# Patient Record
Sex: Female | Born: 1980 | Race: White | Hispanic: No | Marital: Married | Smoking: Never smoker
Health system: Southern US, Community
[De-identification: ages and names within clinical notes are randomized; demographics above are authoritative.]

## PROBLEM LIST (undated history)

## (undated) DIAGNOSIS — M1990 Unspecified osteoarthritis, unspecified site: Secondary | ICD-10-CM

## (undated) DIAGNOSIS — M069 Rheumatoid arthritis, unspecified: Secondary | ICD-10-CM

## (undated) DIAGNOSIS — N200 Calculus of kidney: Secondary | ICD-10-CM

## (undated) HISTORY — DX: Rheumatoid arthritis, unspecified: M06.9

## (undated) HISTORY — PX: CHOLECYSTECTOMY: SHX55

---

## 2012-09-28 ENCOUNTER — Ambulatory Visit: Payer: Self-pay | Admitting: Neurology

## 2012-10-03 DIAGNOSIS — R3915 Urgency of urination: Secondary | ICD-10-CM | POA: Insufficient documentation

## 2012-10-03 DIAGNOSIS — R39198 Other difficulties with micturition: Secondary | ICD-10-CM | POA: Insufficient documentation

## 2012-10-03 DIAGNOSIS — N8111 Cystocele, midline: Secondary | ICD-10-CM | POA: Insufficient documentation

## 2013-03-23 ENCOUNTER — Ambulatory Visit: Payer: Self-pay | Admitting: Family Medicine

## 2013-03-23 LAB — URINALYSIS, COMPLETE
Bacteria: NEGATIVE
Bilirubin,UR: NEGATIVE
Leukocyte Esterase: NEGATIVE
Protein: NEGATIVE
Specific Gravity: 1.015 (ref 1.003–1.030)

## 2013-03-23 LAB — PREGNANCY, URINE: Pregnancy Test, Urine: NEGATIVE m[IU]/mL

## 2013-03-25 LAB — URINE CULTURE

## 2014-02-22 ENCOUNTER — Ambulatory Visit: Payer: Self-pay | Admitting: Family Medicine

## 2014-02-23 ENCOUNTER — Ambulatory Visit: Payer: Self-pay | Admitting: Physician Assistant

## 2015-03-31 ENCOUNTER — Encounter: Payer: Self-pay | Admitting: *Deleted

## 2015-03-31 ENCOUNTER — Emergency Department: Payer: No Typology Code available for payment source

## 2015-03-31 ENCOUNTER — Emergency Department
Admission: EM | Admit: 2015-03-31 | Discharge: 2015-03-31 | Disposition: A | Discharge status: Home / Self Care | Payer: No Typology Code available for payment source | Attending: Emergency Medicine | Admitting: Emergency Medicine

## 2015-03-31 DIAGNOSIS — Z79899 Other long term (current) drug therapy: Secondary | ICD-10-CM | POA: Insufficient documentation

## 2015-03-31 DIAGNOSIS — Z3202 Encounter for pregnancy test, result negative: Secondary | ICD-10-CM | POA: Insufficient documentation

## 2015-03-31 DIAGNOSIS — R1031 Right lower quadrant pain: Secondary | ICD-10-CM | POA: Diagnosis present

## 2015-03-31 DIAGNOSIS — N200 Calculus of kidney: Secondary | ICD-10-CM | POA: Insufficient documentation

## 2015-03-31 HISTORY — DX: Calculus of kidney: N20.0

## 2015-03-31 HISTORY — DX: Unspecified osteoarthritis, unspecified site: M19.90

## 2015-03-31 LAB — COMPREHENSIVE METABOLIC PANEL
ALT: 18 U/L (ref 14–54)
AST: 21 U/L (ref 15–41)
Albumin: 4.9 g/dL (ref 3.5–5.0)
Alkaline Phosphatase: 46 U/L (ref 38–126)
Anion gap: 10 (ref 5–15)
BUN: 15 mg/dL (ref 6–20)
CO2: 24 mmol/L (ref 22–32)
CREATININE: 0.56 mg/dL (ref 0.44–1.00)
Calcium: 9.2 mg/dL (ref 8.9–10.3)
Chloride: 105 mmol/L (ref 101–111)
GFR calc Af Amer: >60 mL/min (ref >60)
GFR calc non Af Amer: >60 mL/min (ref >60)
GLUCOSE: 93 mg/dL (ref 65–99)
Potassium: 3.9 mmol/L (ref 3.5–5.1)
SODIUM: 139 mmol/L (ref 135–145)
Total Bilirubin: 0.7 mg/dL (ref 0.3–1.2)
Total Protein: 7.8 g/dL (ref 6.5–8.1)

## 2015-03-31 LAB — CBC WITH DIFFERENTIAL/PLATELET
BASOS PCT: 0 %
Basophils Absolute: 0 10*3/uL (ref 0–0.1)
Eosinophils Absolute: 0.1 10*3/uL (ref 0–0.7)
Eosinophils Relative: 1 %
HCT: 38.1 % (ref 35.0–47.0)
Hemoglobin: 12.6 g/dL (ref 12.0–16.0)
LYMPHS ABS: 2.2 10*3/uL (ref 1.0–3.6)
Lymphocytes Relative: 15 %
MCH: 30.4 pg (ref 26.0–34.0)
MCHC: 32.9 g/dL (ref 32.0–36.0)
MCV: 92.4 fL (ref 80.0–100.0)
MONOS PCT: 6 %
Monocytes Absolute: 0.8 10*3/uL (ref 0.2–0.9)
Neutro Abs: 11.6 10*3/uL — ABNORMAL HIGH (ref 1.4–6.5)
Neutrophils Relative %: 78 %
Platelets: 331 10*3/uL (ref 150–440)
RBC: 4.13 MIL/uL (ref 3.80–5.20)
RDW: 12.1 % (ref 11.5–14.5)
WBC: 14.7 10*3/uL — AB (ref 3.6–11.0)

## 2015-03-31 LAB — URINALYSIS COMPLETE WITH MICROSCOPIC (ARMC ONLY)
BILIRUBIN URINE: NEGATIVE
Bacteria, UA: NONE SEEN
Glucose, UA: NEGATIVE mg/dL
Leukocytes, UA: NEGATIVE
NITRITE: NEGATIVE
PROTEIN: NEGATIVE mg/dL
Specific Gravity, Urine: 1.019 (ref 1.005–1.030)
pH: 5 (ref 5.0–8.0)

## 2015-03-31 LAB — PREGNANCY, URINE: PREG TEST UR: NEGATIVE

## 2015-03-31 MED ORDER — HYDROCODONE-ACETAMINOPHEN 5-325 MG PO TABS: 1.0000 | ORAL_TABLET | Freq: Four times a day (QID) | ORAL | Status: DC | PRN

## 2015-03-31 MED ORDER — ONDANSETRON 4 MG PO TBDP: 4.0000 mg | ORAL_TABLET | Freq: Four times a day (QID) | ORAL | Status: DC | PRN

## 2015-03-31 MED ORDER — KETOROLAC TROMETHAMINE 30 MG/ML IJ SOLN
30.0000 mg | Freq: Once | INTRAMUSCULAR | Status: AC
  Administered 2015-03-31: 30 mg via INTRAVENOUS

## 2015-03-31 MED ORDER — KETOROLAC TROMETHAMINE 30 MG/ML IJ SOLN
INTRAMUSCULAR | Status: AC
  Filled 2015-03-31: qty 1

## 2015-03-31 MED ORDER — MORPHINE SULFATE 4 MG/ML IJ SOLN
INTRAMUSCULAR | Status: AC
  Filled 2015-03-31: qty 1

## 2015-03-31 MED ORDER — ONDANSETRON HCL 4 MG/2ML IJ SOLN
INTRAMUSCULAR | Status: AC
  Filled 2015-03-31: qty 2

## 2015-03-31 MED ORDER — SODIUM CHLORIDE 0.9 % IV BOLUS (SEPSIS)
1000.0000 mL | Freq: Once | INTRAVENOUS | Status: AC
  Administered 2015-03-31: 1000 mL via INTRAVENOUS

## 2015-03-31 MED ORDER — IOHEXOL 300 MG/ML  SOLN
80.0000 mL | Freq: Once | INTRAMUSCULAR | Status: AC | PRN
  Administered 2015-03-31: 80 mL via INTRAVENOUS

## 2015-03-31 MED ORDER — IOHEXOL 240 MG/ML SOLN
25.0000 mL | Freq: Once | INTRAMUSCULAR | Status: AC | PRN
  Administered 2015-03-31: 25 mL via ORAL

## 2015-03-31 MED ORDER — MORPHINE SULFATE 4 MG/ML IJ SOLN
4.0000 mg | Freq: Once | INTRAMUSCULAR | Status: AC
  Administered 2015-03-31: 4 mg via INTRAVENOUS

## 2015-03-31 MED ORDER — TAMSULOSIN HCL 0.4 MG PO CAPS: 0.4000 mg | ORAL_CAPSULE | Freq: Every day | ORAL | Status: DC

## 2015-03-31 MED ORDER — ONDANSETRON HCL 4 MG/2ML IJ SOLN
4.0000 mg | INTRAMUSCULAR | Status: AC
  Administered 2015-03-31: 4 mg via INTRAVENOUS

## 2015-03-31 NOTE — Discharge Instructions (Signed)
Kidney Stones  Please follow-up with either [facility] or [facility]. Return to the ER right away should you develop a fever, severe pain, feel dehydrated, severe nausea, or if other concerns arise.  Kidney stones (urolithiasis) are deposits that form inside your kidneys. The intense pain is caused by the stone moving through the urinary tract. When the stone moves, the ureter goes into spasm around the stone. The stone is usually passed in the urine.  CAUSES   A disorder that makes certain neck glands produce too much parathyroid hormone (primary hyperparathyroidism).  A buildup of uric acid crystals, similar to gout in your joints.  Narrowing (stricture) of the ureter.  A kidney obstruction present at birth (congenital obstruction).  Previous surgery on the kidney or ureters.  Numerous kidney infections. SYMPTOMS   Feeling sick to your stomach (nauseous).  Throwing up (vomiting).  Blood in the urine (hematuria).  Pain that usually spreads (radiates) to the groin.  Frequency or urgency of urination. DIAGNOSIS   Taking a history and physical exam.  Blood or urine tests.  CT scan.  Occasionally, an examination of the inside of the urinary bladder (cystoscopy) is performed. TREATMENT   Observation.  Increasing your fluid intake.  Extracorporeal shock wave lithotripsy--This is a noninvasive procedure that uses shock waves to break up kidney stones.  Surgery may be needed if you have severe pain or persistent obstruction. There are various surgical procedures. Most of the procedures are performed with the use of small instruments. Only small incisions are needed to accommodate these instruments, so recovery time is minimized. The size, location, and chemical composition are all important variables that will determine the proper choice of action for you. Talk to your health care provider to better understand your situation so that you will minimize the risk of  injury to yourself and your kidney.  HOME CARE INSTRUCTIONS   Drink enough water and fluids to keep your urine clear or pale yellow. This will help you to pass the stone or stone fragments.  Strain all urine through the provided strainer. Keep all particulate matter and stones for your health care provider to see. The stone causing the pain may be as small as a grain of salt. It is very important to use the strainer each and every time you pass your urine. The collection of your stone will allow your health care provider to analyze it and verify that a stone has actually passed. The stone analysis will often identify what you can do to reduce the incidence of recurrences.  Only take over-the-counter or prescription medicines for pain, discomfort, or fever as directed by your health care provider.  Make a follow-up appointment with your health care provider as directed.  Get follow-up X-rays if required. The absence of pain does not always mean that the stone has passed. It may have only stopped moving. If the urine remains completely obstructed, it can cause loss of kidney function or even complete destruction of the kidney. It is your responsibility to make sure X-rays and follow-ups are completed. Ultrasounds of the kidney can show blockages and the status of the kidney. Ultrasounds are not associated with any radiation and can be performed easily in a matter of minutes. SEEK MEDICAL CARE IF:  You experience pain that is progressive and unresponsive to any pain medicine you have been prescribed. SEEK IMMEDIATE MEDICAL CARE IF:   Pain cannot be controlled with the prescribed medicine.  You have a fever or shaking chills.  The severity or intensity of pain increases over 18 hours and is not relieved by pain medicine.  You develop a new onset of abdominal pain.  You feel faint or pass out.  You are unable to urinate. MAKE SURE YOU:   Understand these instructions.  Will watch your  condition.  Will get help right away if you are not doing well or get worse. Document Released: 10/09/2005 Document Revised: 06/11/2013 Document Reviewed: 03/12/2013 [facility] Patient Information 2015 [place], [place]. This information is not intended to replace advice given to you by your health care provider. Make sure you discuss any questions you have with your health care provider.

## 2015-03-31 NOTE — ED Notes (Signed)
Pt ambulatory to bathroom at this time with no concerns.  

## 2015-03-31 NOTE — ED Notes (Signed)
Pt reports lower right abdominal pain and lower back pain since this morning, has only urinated a small amount today when she goes.  Pt reports history of kidney stones, and feels similar.

## 2015-03-31 NOTE — ED Provider Notes (Signed)
[facility] Emergency Department Provider Note  ____________________________________________  Time seen: Approximately 3:48 PM  I have reviewed the triage vital signs and the nursing notes.   HISTORY  Chief Complaint Abdominal Pain    HPI Rita Baker is a 34 y.o. female with a history of questionable kidney stones several years ago while in [place]. She is a very pleasant young lady, and she notes that she's been having discomfort in her right side since last night. States it feels very sharp into her back but does radiate down towards her right groin. She does not have a vomiting. She does feel slightly nauseated during the pain. The pain was worse earlier, but has improved some now. She rates it a 7 out of 10.  She's not had a fever. She denies any pain in the left side. No chest pain no cough. She had 1 loose bowel movement last night, but no blood, no vomiting.     Past Medical History  Diagnosis Date  . Kidney stone   . Arthritis     rheumatoid    There are no active problems to display for this patient.   Past Surgical History  Procedure Laterality Date  . Cesarean section      Current Outpatient Rx  Name  Route  Sig  Dispense  Refill  . HYDROcodone-acetaminophen (NORCO/VICODIN) 5-325 MG per tablet   Oral   Take 1 tablet by mouth every 6 (six) hours as needed for moderate pain.   25 tablet   0   . ondansetron (ZOFRAN ODT) 4 MG disintegrating tablet   Oral   Take 1 tablet (4 mg total) by mouth every 6 (six) hours as needed for nausea or vomiting.   20 tablet   0   . tamsulosin (FLOMAX) 0.4 MG CAPS capsule   Oral   Take 1 capsule (0.4 mg total) by mouth daily.   7 capsule   0     Allergies Review of patient's allergies indicates no known allergies.  No family history on file.  Social History History  Substance Use Topics  . Smoking status: Never Smoker   . Smokeless tobacco: Not on file  . Alcohol Use: No    Review  of Systems Constitutional: No fever/chills Eyes: No visual changes. ENT: No sore throat. Cardiovascular: Denies chest pain. Respiratory: Denies shortness of breath. Gastrointestinal: See history of present illness Genitourinary: Patient has noticed that she feels increased frequency of urination. Musculoskeletal: Negative for back pain. Skin: Negative for rash. Neurological: Negative for headaches, focal weakness or numbness.  10-point ROS otherwise negative.  ____________________________________________   PHYSICAL EXAM:  VITAL SIGNS: ED Triage Vitals  Enc Vitals Group     BP 03/31/15 1419 144/95 mmHg     Pulse Rate 03/31/15 1419 94     Resp 03/31/15 1419 18     Temp 03/31/15 1419 98.8 F (37.1 C)     Temp Source 03/31/15 1419 Oral     SpO2 03/31/15 1419 99 %     Weight 03/31/15 1419 133 lb (60.328 kg)     Height 03/31/15 1419 5\' 4"  (1.626 m)     Head Cir --      Peak Flow --      Pain Score 03/31/15 1419 8     Pain Loc --      Pain Edu? --      Excl. in GC? --     Constitutional: Alert and oriented. Well appearing and in  no acute distress. Eyes: Conjunctivae are normal. PERRL. EOMI. Head: Atraumatic. Nose: No congestion/rhinnorhea. Mouth/Throat: Mucous membranes are moist.  Oropharynx non-erythematous. Neck: No stridor.   Cardiovascular: Normal rate, regular rhythm. Grossly normal heart sounds.  Good peripheral circulation. Respiratory: Normal respiratory effort.  No retractions. Lungs CTAB. Gastrointestinal: Patient has moderate right-sided CVA tenderness, none on the left. Patient does have moderate tenderness to palpation in the left flank and right lower quadrant. His pain at McBurney's point, but this pain extends up into the right flank and does not seem to localize obviously to the right lower. No distention. No abdominal bruits. Left side nontender Musculoskeletal: No lower extremity tenderness nor edema.  No joint effusions. Neurologic:  Normal speech and  language. No gross focal neurologic deficits are appreciated. Speech is normal.  Skin:  Skin is warm, dry and intact. No rash noted. Psychiatric: Mood and affect are normal. Speech and behavior are normal.  ____________________________________________   LABS (all labs ordered are listed, but only abnormal results are displayed)  Labs Reviewed  CBC WITH DIFFERENTIAL/PLATELET - Abnormal; Notable for the following:    WBC 14.7 (*)    Neutro Abs 11.6 (*)    All other components within normal limits  URINALYSIS COMPLETEWITH MICROSCOPIC ([facility] ONLY) - Abnormal; Notable for the following:    Color, Urine YELLOW (*)    APPearance CLEAR (*)    Ketones, ur 2+ (*)    Hgb urine dipstick 3+ (*)    Squamous Epithelial / LPF 0-5 (*)    All other components within normal limits  COMPREHENSIVE METABOLIC PANEL  PREGNANCY, URINE   ____________________________________________  EKG   ____________________________________________  RADIOLOGY  IMPRESSION: 1. 6 mm obstructive calculus in the distal third of the right ureter shortly before the right ureterovesicular junction associated with mild proximal hydroureteronephrosis. 2. Several additional nonobstructive calculi within the right renal collecting system measuring 3-4 mm in size. 3. Normal appendix. 4. Additional incidental findings, as above. ____________________________________________   PROCEDURES  Procedure(s) performed: None  Critical Care performed: No  ____________________________________________   INITIAL IMPRESSION / ASSESSMENT AND PLAN / ED COURSE  Pertinent labs & imaging results that were available during my care of the patient were reviewed by me and considered in my medical decision making (see chart for details).  Patient has about 12+ hours of right back and right flank pain extending down the right pelvis. Her labs are notable for an elevated white count and hematuria. Based on her history of questionable  previous kidney stones, serum that this pain she is having may be from passage of a kidney stone, but given the amount of pain and tenderness that she does have in the right flank and also into the right lower quadrant with a history of previous rheumatoid I feel obtaining a CT with contrast to evaluate for any evidence of colitis, appendicitis is also reasonable. She doesn't have any "pelvic" pain, and I feel that her pain does not localize well to the right lower pelvis which makes torsion less likely. In consideration though, should her CT not demonstrate any acute abnormality I would recommend obtaining ovarian ultrasound.  Pain control with Toradol, IV fluids, Zofran. Patient requests not to receive any strong narcotic medicines initially. ____________________________________________  ----------------------------------------- 4:35 PM on 03/31/2015 -----------------------------------------  Reevaluated patient. She is fully awake and alert, she states her pain is improved but is still having some and requests additional medicine. I discussed and we will give her some morphine at this time.  -----------------------------------------  5:18 PM on 03/31/2015 -----------------------------------------  Patient feels much improved. She is awake and alert. I discussed her diagnosis, treatment recommendations, and follow-up with either [place] [facility] or [facility] urology as she lives in [place]. We discussed return precautions, and she is quite amenable. We also discussed safe use of Vicodin and not driving, she is quite agreeable and will use as prescribed.  FINAL CLINICAL IMPRESSION(S) / ED DIAGNOSES  Final diagnoses:  Right nephrolithiasis      Sharyn Creamer, MD 03/31/15 [phone]

## 2015-03-31 NOTE — ED Notes (Signed)
MD Quale at bedside. 

## 2015-03-31 NOTE — ED Notes (Signed)
Pt at ct at this time. Will administer meds once pt returns.

## 2015-05-20 ENCOUNTER — Ambulatory Visit (INDEPENDENT_AMBULATORY_CARE_PROVIDER_SITE_OTHER): Payer: No Typology Code available for payment source | Admitting: Urology

## 2015-05-20 ENCOUNTER — Encounter: Payer: Self-pay | Admitting: Urology

## 2015-05-20 VITALS — BP 143/95 | HR 97 | Ht 64.0 in | Wt 133.6 lb

## 2015-05-20 DIAGNOSIS — N132 Hydronephrosis with renal and ureteral calculous obstruction: Secondary | ICD-10-CM

## 2015-05-20 DIAGNOSIS — N200 Calculus of kidney: Secondary | ICD-10-CM

## 2015-05-20 LAB — URINALYSIS, COMPLETE
BILIRUBIN UA: NEGATIVE
Glucose, UA: NEGATIVE
Ketones, UA: NEGATIVE
Leukocytes, UA: NEGATIVE
NITRITE UA: NEGATIVE
Protein, UA: NEGATIVE
Specific Gravity, UA: 1.01 (ref 1.005–1.030)
Urobilinogen, Ur: 0.2 mg/dL (ref 0.2–1.0)
pH, UA: 7 (ref 5.0–7.5)

## 2015-05-20 LAB — MICROSCOPIC EXAMINATION
Bacteria, UA: NONE SEEN
RBC, UA: NONE SEEN /hpf (ref 0–?)
WBC, UA: NONE SEEN /hpf (ref 0–?)

## 2015-05-20 NOTE — Progress Notes (Addendum)
05/20/2015 3:37 PM   Rita Baker June 04, 1981 [id number]  Referring provider: Selina Cooley, MD No address on file  Chief Complaint  Patient presents with  . Nephrolithiasis    referral from [facility] x 03/31/15. he pt passed the stone on th 6/12.     HPI: Rita Baker is a 34 year old white female who was seen in the ED on  03/31/2015 for an intense right-sided flank pain. She described the pain as sharp and radiating down into her right groin. She is not having fevers or chills. She was experiencing nausea but no vomiting.    A CT scan with contrast of the abdomen and pelvis was obtained. It demonstrated a 6 mm obstructive calculus in the distal third of the right ureter shortly before the right UVJ junction associated with mild proximal hydronephrosis.  It also identified 3 nonobstructing right renal stones.  She has since passed stone.  I will send it for analysis.    Today, she is not experiencing any right sided flank pain. She is also not having any urinary symptoms. She denies any gross hematuria, fevers, chills, nausea or vomiting.  Her UA today is unremarkable.  PMH: Past Medical History  Diagnosis Date  . Kidney stone   . Arthritis     rheumatoid  . Rheumatoid arthritis     Surgical History: Past Surgical History  Procedure Laterality Date  . Cesarean section      x2    Home Medications:    Medication List       This list is accurate as of: 05/20/15  3:37 PM.  Always use your most recent med list.               HYDROcodone-acetaminophen 5-325 MG per tablet  Commonly known as:  NORCO/VICODIN  Take 1 tablet by mouth every 6 (six) hours as needed for moderate pain.     meloxicam 15 MG tablet  Commonly known as:  MOBIC     ondansetron 4 MG disintegrating tablet  Commonly known as:  ZOFRAN ODT  Take 1 tablet (4 mg total) by mouth every 6 (six) hours as needed for nausea or vomiting.     SPRINTEC 28 0.25-35 MG-MCG tablet  Generic drug:   norgestimate-ethinyl estradiol     sulfaSALAzine 500 MG tablet  Commonly known as:  AZULFIDINE     tamsulosin 0.4 MG Caps capsule  Commonly known as:  FLOMAX  Take 1 capsule (0.4 mg total) by mouth daily.     TRI-LO-SPRINTEC 0.18/0.215/0.25 MG-25 MCG tab  Generic drug:  Norgestimate-Ethinyl Estradiol Triphasic        Allergies: No Known Allergies  Family History: Family History  Problem Relation Age of Onset  . Family history unknown: Yes    Social History:  reports that she has never smoked. She does not have any smokeless tobacco history on file. She reports that she does not drink alcohol or use illicit drugs.  ROS: UROLOGY Frequent Urination?: Yes (n) Hard to postpone urination?: Yes Burning/pain with urination?: No Get up at night to urinate?: Yes Leakage of urine?: Yes Urine stream starts and stops?: No Trouble starting stream?: No Do you have to strain to urinate?: No Blood in urine?: No Urinary tract infection?: No Sexually transmitted disease?: No Injury to kidneys or bladder?: No Painful intercourse?: No Weak stream?: No Currently pregnant?: No Vaginal bleeding?: No Last menstrual period?: no  Gastrointestinal Nausea?: No Vomiting?: No Indigestion/heartburn?: No Diarrhea?: No Constipation?: No  Constitutional  Fever: No Night sweats?: No Weight loss?: No Fatigue?: No  Skin Skin rash/lesions?: No Itching?: No  Eyes Blurred vision?: No Double vision?: No  Ears/Nose/Throat Sore throat?: No Sinus problems?: No  Hematologic/Lymphatic Swollen glands?: No Easy bruising?: No  Cardiovascular Leg swelling?: No Chest pain?: No  Respiratory Cough?: No Shortness of breath?: No  Endocrine Excessive thirst?: No  Musculoskeletal Back pain?: No Joint pain?: No  Neurological Headaches?: No Dizziness?: No  Psychologic Depression?: No Anxiety?: No  Physical Exam: BP 143/95 mmHg  Pulse 97  Ht  (1.626 m)  Wt 133 lb 9.6 oz  (60.601 kg)  BMI 22.92 kg/m2  LMP 04/18/2015  Constitutional:  Alert and oriented, No acute distress. HEENT: [place] AT, moist mucus membranes.  Trachea midline, no masses. Cardiovascular: No clubbing, cyanosis, or edema. Respiratory: Normal respiratory effort, no increased work of breathing. GI: Abdomen is soft, nontender, nondistended, no abdominal masses GU: No CVA tenderness.  Skin: No rashes, bruises or suspicious lesions. Lymph: No cervical or inguinal adenopathy. Neurologic: Grossly intact, no focal deficits, moving all 4 extremities. Psychiatric: Normal mood and affect.  Laboratory Data: Lab Results  Component Value Date   WBC 14.7* 03/31/2015   HGB 12.6 03/31/2015   HCT 38.1 03/31/2015   MCV 92.4 03/31/2015   PLT 331 03/31/2015    Lab Results  Component Value Date   CREATININE 0.56 03/31/2015    No results found for: PSA  No results found for: TESTOSTERONE  No results found for: HGBA1C  Urinalysis    Component Value Date/Time   COLORURINE YELLOW* 03/31/2015 1426   COLORURINE YELLOW 03/23/2013 0850   APPEARANCEUR CLEAR* 03/31/2015 1426   APPEARANCEUR CLEAR 03/23/2013 0850   LABSPEC 1.019 03/31/2015 1426   LABSPEC 1.015 03/23/2013 0850   PHURINE 5.0 03/31/2015 1426   PHURINE 6.0 03/23/2013 0850   GLUCOSEU NEGATIVE 03/31/2015 1426   GLUCOSEU NEGATIVE 03/23/2013 0850   HGBUR 3+* 03/31/2015 1426   HGBUR 1+ 03/23/2013 0850   BILIRUBINUR NEGATIVE 03/31/2015 1426   BILIRUBINUR NEGATIVE 03/23/2013 0850   KETONESUR 2+* 03/31/2015 1426   KETONESUR NEGATIVE 03/23/2013 0850   PROTEINUR NEGATIVE 03/31/2015 1426   PROTEINUR NEGATIVE 03/23/2013 0850   NITRITE NEGATIVE 03/31/2015 1426   NITRITE NEGATIVE 03/23/2013 0850   LEUKOCYTESUR NEGATIVE 03/31/2015 1426   LEUKOCYTESUR NEGATIVE 03/23/2013 0850    Pertinent Imaging:   Assessment & Plan:    1. Nephrolithiasis:   Patient has spontaneously passed a right UVJ stone. The stone be sent for analysis. She will also  undergo a 24-hour urine workup. She has 3 remaining stones in her right kidney. She will contact us if she should feel any intense flank pain or gross hematuria.   - Urinalysis, Complete  2. Hydronephrosis:   Patient was found to have hydronephrosis on her CT scan in June. We will order a renal ultrasound to ensure the hydronephrosis has resolved since the passage of her stone.  No Follow-up on file.  Michiel Cowboy, PA-C  [facility] [address], [address] [place], [place] [address] [phone]   Addendum: Larina Bras analysis shows stone composition calcium oxalate monohydrate 75%, calcium oxalate dihydrate 10%, calcium phosphate carbonate 15%.

## 2015-05-24 ENCOUNTER — Telehealth: Payer: Self-pay | Admitting: Urology

## 2015-05-24 ENCOUNTER — Encounter: Payer: Self-pay | Admitting: Urology

## 2015-05-24 DIAGNOSIS — N132 Hydronephrosis with renal and ureteral calculous obstruction: Secondary | ICD-10-CM | POA: Insufficient documentation

## 2015-05-24 DIAGNOSIS — N200 Calculus of kidney: Secondary | ICD-10-CM | POA: Insufficient documentation

## 2015-05-24 NOTE — Telephone Encounter (Signed)
I forgot to order a RUS for the patient at the time of her visit.  Will you call the patient and apologize for me and schedule the RUS.  We have to make sure the hydronephrosis has resolved.

## 2015-05-25 NOTE — Telephone Encounter (Signed)
Spoke with pt in reference to RUS. Pt voiced understanding and all questions were answered.

## 2015-05-26 ENCOUNTER — Other Ambulatory Visit: Payer: Self-pay | Admitting: Urology

## 2015-05-26 DIAGNOSIS — N132 Hydronephrosis with renal and ureteral calculous obstruction: Secondary | ICD-10-CM

## 2015-06-01 ENCOUNTER — Ambulatory Visit
Admission: RE | Admit: 2015-06-01 | Discharge: 2015-06-01 | Disposition: A | Discharge status: Home / Self Care | Payer: No Typology Code available for payment source | Source: Ambulatory Visit | Attending: Urology | Admitting: Urology

## 2015-06-01 DIAGNOSIS — N200 Calculus of kidney: Secondary | ICD-10-CM | POA: Diagnosis not present

## 2015-06-01 DIAGNOSIS — N132 Hydronephrosis with renal and ureteral calculous obstruction: Secondary | ICD-10-CM

## 2015-06-02 ENCOUNTER — Telehealth: Payer: Self-pay

## 2015-06-02 NOTE — Telephone Encounter (Signed)
Spoke with pt in reference to u/a results. Pt stated she would like to think about treating current stones and therefore she would call back to make appt. Reinforced the need of completing LithoLink. Pt voiced understanding.

## 2015-06-02 NOTE — Telephone Encounter (Signed)
-----   Message from Vanna Scotland, MD sent at 06/01/2015  4:41 PM EDT ----- Please let patient know that her ultrasound looked good, swelling has resolved. She does have some nonobstructing stones on the right side which appeared to be decently size. She should consider treatment for these and if interested, she return to our clinic to discuss this further. At minimum, I would recommend follow-up in 1 year with [facility].  Vanna Scotland, MD

## 2015-06-10 ENCOUNTER — Other Ambulatory Visit: Payer: No Typology Code available for payment source

## 2015-06-21 ENCOUNTER — Telehealth: Payer: Self-pay | Admitting: Urology

## 2015-06-21 NOTE — Telephone Encounter (Signed)
Stone analysis has demonstrated a stone composition of calcium oxalate monohydrate, calcium oxalate dihydrate and calcium phosphate carbonate. Generally, increasing water consumption to 2 L daily, consuming citric juices such as lemonade and avoiding sodas can aid in preventing further stone formation. For a more detailed prevention plan, I recommend undergoing a 24-hour urine for metabolic workup.

## 2015-06-22 NOTE — Telephone Encounter (Signed)
LMOM

## 2015-06-22 NOTE — Telephone Encounter (Signed)
Spoke with pt in reference to stone analysis. Pt voiced understanding. Pt stated she was going to keep appt for 07/02/15 due to wanting a more detailed prevention plan.

## 2015-06-30 ENCOUNTER — Other Ambulatory Visit: Payer: Self-pay | Admitting: Urology

## 2015-07-02 ENCOUNTER — Encounter: Payer: Self-pay | Admitting: Urology

## 2015-07-02 ENCOUNTER — Ambulatory Visit (INDEPENDENT_AMBULATORY_CARE_PROVIDER_SITE_OTHER): Payer: No Typology Code available for payment source | Admitting: Urology

## 2015-07-02 VITALS — BP 132/91 | HR 91 | Resp 16 | Ht 64.0 in | Wt 131.0 lb

## 2015-07-02 DIAGNOSIS — R350 Frequency of micturition: Secondary | ICD-10-CM | POA: Diagnosis not present

## 2015-07-02 DIAGNOSIS — N200 Calculus of kidney: Secondary | ICD-10-CM

## 2015-07-02 MED ORDER — MIRABEGRON ER 25 MG PO TB24: 25.0000 mg | ORAL_TABLET | Freq: Every day | ORAL | Status: DC

## 2015-07-02 NOTE — Progress Notes (Signed)
07/02/2015 8:57 PM   Chryl Heck 16-Oct-1981 [id number]  Referring provider: Selina Cooley, MD No address on file  Chief Complaint  Patient presents with  . Follow-up    24 hour urine results    HPI: Patient presents today to discuss results of her 24-hour urine.      She was found to have suboptimal urine volumes and borderline hyperoxaluria.  I have given her the diet guides for a low oxalate diet and increasing fluids. She expressed to me that she has a longtime history of urinary frequency and that is contributing to her decrease in fluid consumption. She is afraid if she increases her fluids, it will also increase her trips to the bathroom.    She states that she has the urge to void sometimes as much as every 30 minutes. She also has nocturia 1-2. She denied any suprapubic pain, gross hematuria or dysuria. She has not sought treatment for this condition in the past.  She has some mild urinary and stress incontinence for which she does not use pads.  PMH: Past Medical History  Diagnosis Date  . Kidney stone   . Arthritis     rheumatoid  . Rheumatoid arthritis     Surgical History: Past Surgical History  Procedure Laterality Date  . Cesarean section      x2    Home Medications:    Medication List       This list is accurate as of: 07/02/15 11:59 PM.  Always use your most recent med list.               meloxicam 15 MG tablet  Commonly known as:  MOBIC     mirabegron ER 25 MG Tb24 tablet  Commonly known as:  MYRBETRIQ  Take 1 tablet (25 mg total) by mouth daily.     SPRINTEC 28 0.25-35 MG-MCG tablet  Generic drug:  norgestimate-ethinyl estradiol     sulfaSALAzine 500 MG tablet  Commonly known as:  AZULFIDINE        Allergies: No Known Allergies  Family History: Family History  Problem Relation Age of Onset  . Family history unknown: Yes    Social History:  reports that she has never smoked. She does not have any smokeless tobacco  history on file. She reports that she does not drink alcohol or use illicit drugs.  ROS: UROLOGY Frequent Urination?: Yes Hard to postpone urination?: No Burning/pain with urination?: No Get up at night to urinate?: No Leakage of urine?: Yes Urine stream starts and stops?: No Trouble starting stream?: No Do you have to strain to urinate?: No Blood in urine?: No Urinary tract infection?: No Sexually transmitted disease?: No Injury to kidneys or bladder?: No Painful intercourse?: No Weak stream?: No Currently pregnant?: No Vaginal bleeding?: No Last menstrual period?: 06/19/15  Gastrointestinal Nausea?: No Vomiting?: No Indigestion/heartburn?: No Diarrhea?: No Constipation?: No  Constitutional Fever: No Night sweats?: No Weight loss?: No Fatigue?: No  Skin Skin rash/lesions?: No Itching?: No  Eyes Blurred vision?: No Double vision?: No  Ears/Nose/Throat Sore throat?: No Sinus problems?: No  Hematologic/Lymphatic Swollen glands?: No Easy bruising?: No  Cardiovascular Leg swelling?: No Chest pain?: No  Respiratory Cough?: No Shortness of breath?: No  Endocrine Excessive thirst?: No  Musculoskeletal Back pain?: No Joint pain?: No  Neurological Headaches?: No Dizziness?: No  Psychologic Depression?: No Anxiety?: No  Physical Exam: BP 132/91 mmHg  Pulse 91  Resp 16  Ht  (1.626 m)  Wt 131 lb (59.421 kg)  BMI 22.47 kg/m2  LMP 06/19/2015   Laboratory Data: Lab Results  Component Value Date   WBC 14.7* 03/31/2015   HGB 12.6 03/31/2015   HCT 38.1 03/31/2015   MCV 92.4 03/31/2015   PLT 331 03/31/2015    Lab Results  Component Value Date   CREATININE 0.56 03/31/2015    Urinalysis    Component Value Date/Time   COLORURINE YELLOW* 03/31/2015 1426   COLORURINE YELLOW 03/23/2013 0850   APPEARANCEUR CLEAR* 03/31/2015 1426   APPEARANCEUR CLEAR 03/23/2013 0850   LABSPEC 1.019 03/31/2015 1426   LABSPEC 1.015 03/23/2013 0850    PHURINE 5.0 03/31/2015 1426   PHURINE 6.0 03/23/2013 0850   GLUCOSEU Negative 05/20/2015 1520   GLUCOSEU NEGATIVE 03/23/2013 0850   HGBUR 3+* 03/31/2015 1426   HGBUR 1+ 03/23/2013 0850   BILIRUBINUR Negative 05/20/2015 1520   BILIRUBINUR NEGATIVE 03/31/2015 1426   BILIRUBINUR NEGATIVE 03/23/2013 0850   KETONESUR 2+* 03/31/2015 1426   KETONESUR NEGATIVE 03/23/2013 0850   PROTEINUR NEGATIVE 03/31/2015 1426   PROTEINUR NEGATIVE 03/23/2013 0850   NITRITE Negative 05/20/2015 1520   NITRITE NEGATIVE 03/31/2015 1426   NITRITE NEGATIVE 03/23/2013 0850   LEUKOCYTESUR Negative 05/20/2015 1520   LEUKOCYTESUR NEGATIVE 03/31/2015 1426   LEUKOCYTESUR NEGATIVE 03/23/2013 0850    Pertinent Imaging:   Assessment & Plan:    1. Nephrolithiasis:   Patient's 24-hour urine has noted that she is not taking in enough fluid for adequate urine volumes and has a high oxalate level. I have given her dietary guidance and instructed her to increase her fluids.  2. Urinary frequency:   Patient is fearful in increasing her dietary fluids as it will cause an increase in her urinary frequency. We discussed starting the medication for her urinary frequency in hopes that it would control her frequency and give her confidence that she can increase her dietary fluids. I have given her Myrbetriq 25 mg samples (#28), a prescription and coupon for the medication. She will return in 6 weeks' time for symptom recheck and PVR.  There are no diagnoses linked to this encounter.  Return in about 6 weeks (around 08/13/2015) for PVR.  Michiel Cowboy, PA-C  [facility] [address], [address] [place], [place] [address] [phone]

## 2015-07-04 DIAGNOSIS — R350 Frequency of micturition: Secondary | ICD-10-CM | POA: Insufficient documentation

## 2015-07-12 ENCOUNTER — Other Ambulatory Visit: Payer: Self-pay | Admitting: Urology

## 2015-08-18 IMAGING — US US RENAL
1 series · 14 of 25 positions shown · non-contrast
Comparison: CT abdomen pelvis 03/31/2015.

CLINICAL DATA: Hydronephrosis.

EXAM:
RENAL / URINARY TRACT ULTRASOUND COMPLETE

[Series 1: us renal · 0.22mm/px · 14 of 53 slices shown]
[im 1/53]
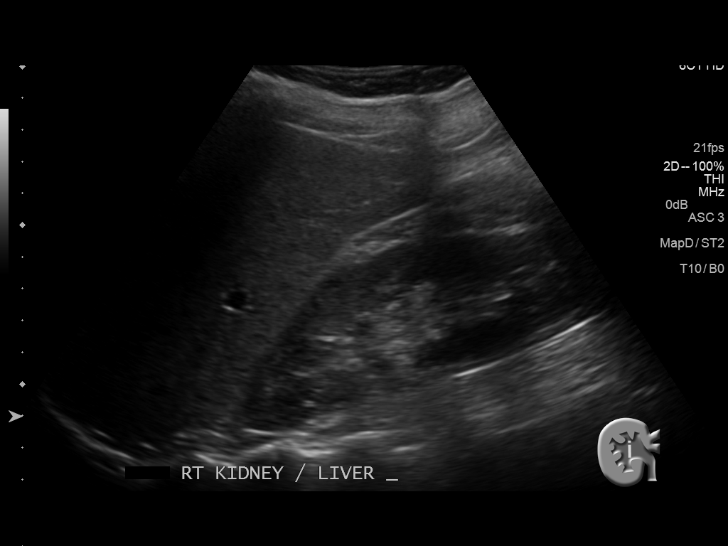
[im 5/53]
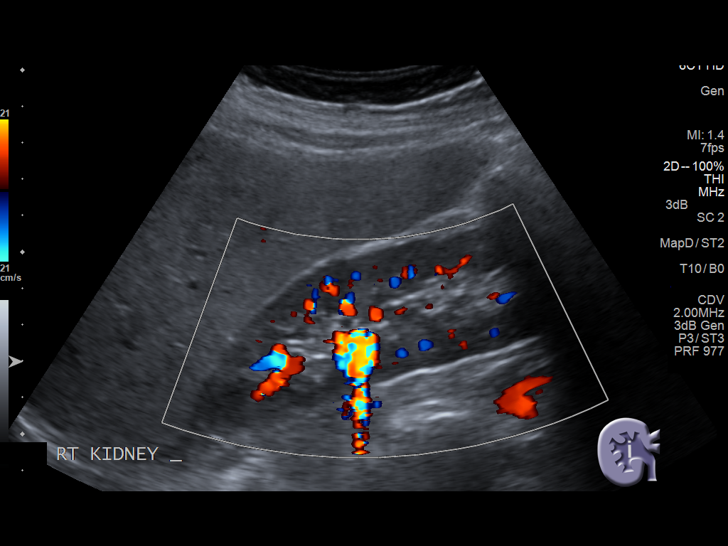
[im 9/53]
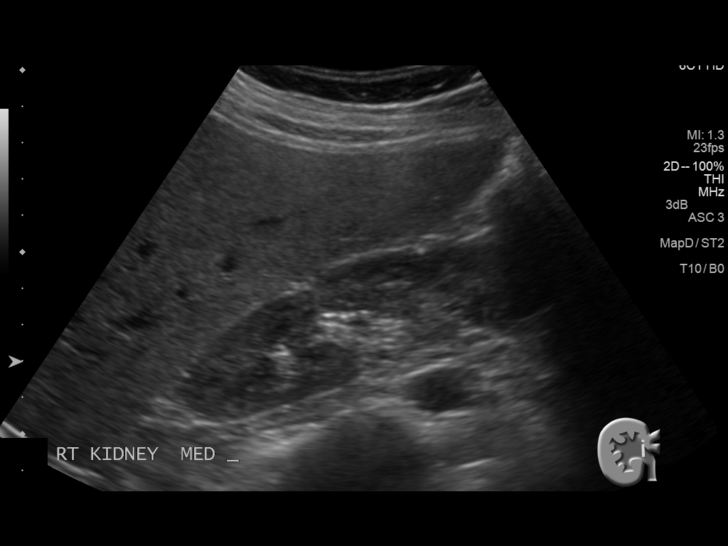
[im 14/53]
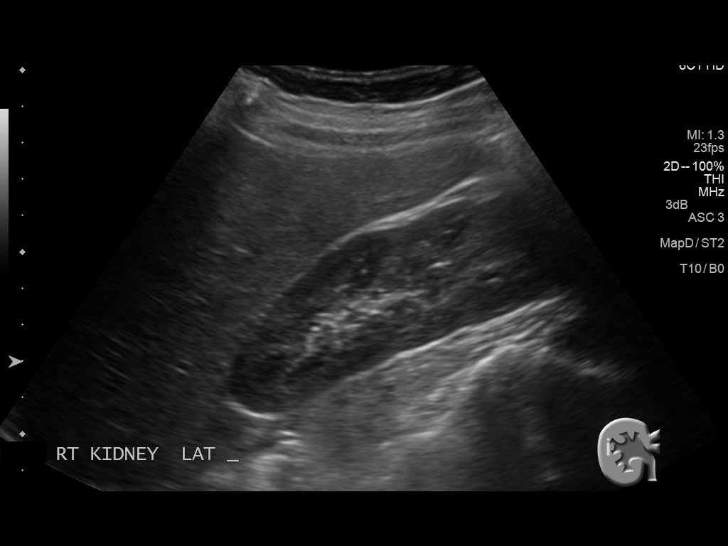
[im 18/53]
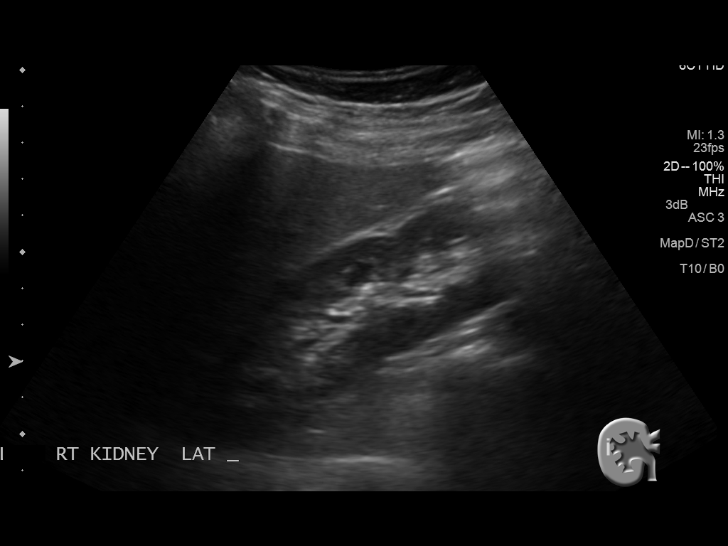
[im 20/53]
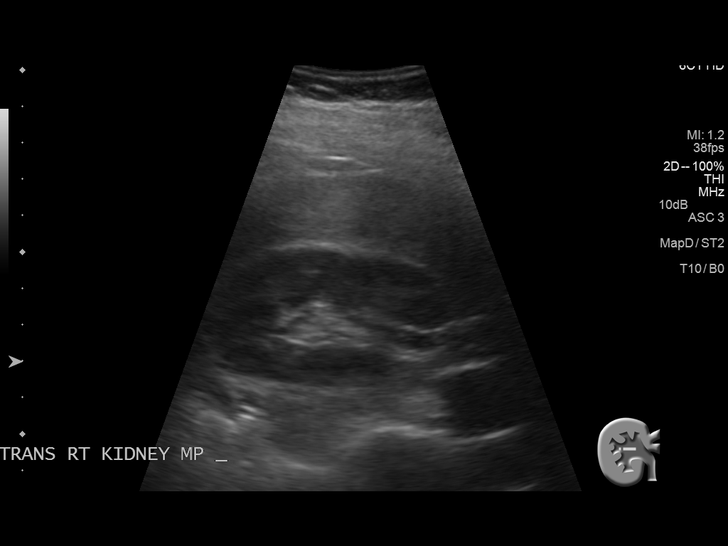
[im 24/53]
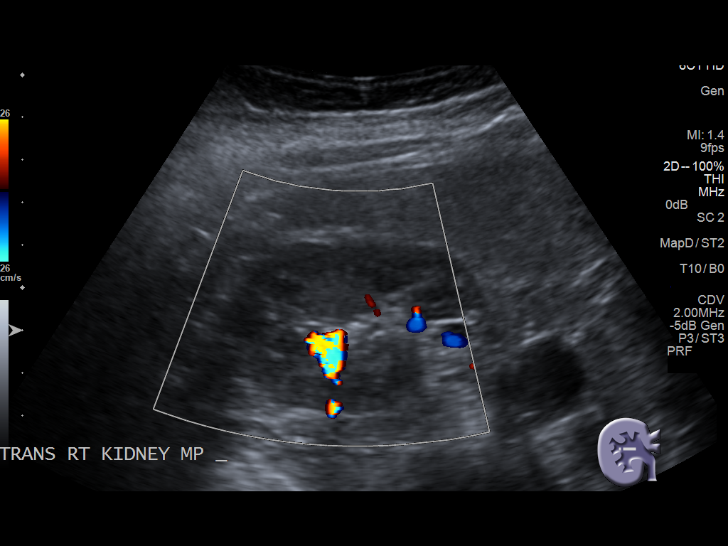
[im 29/53]
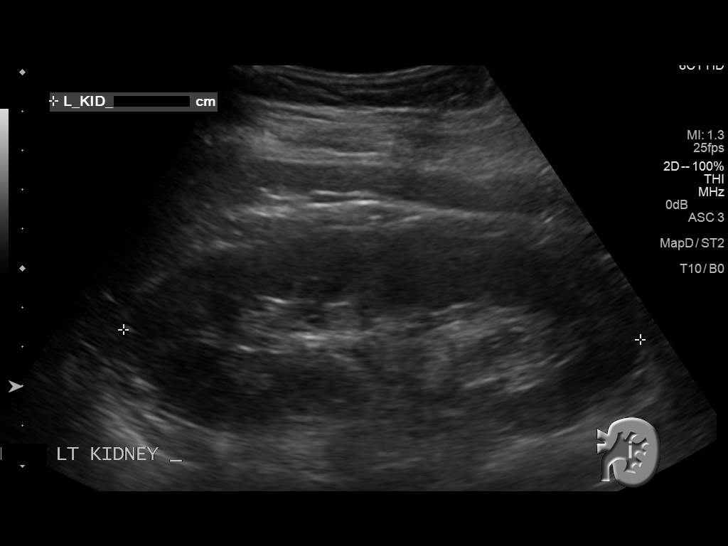
[im 33/53]
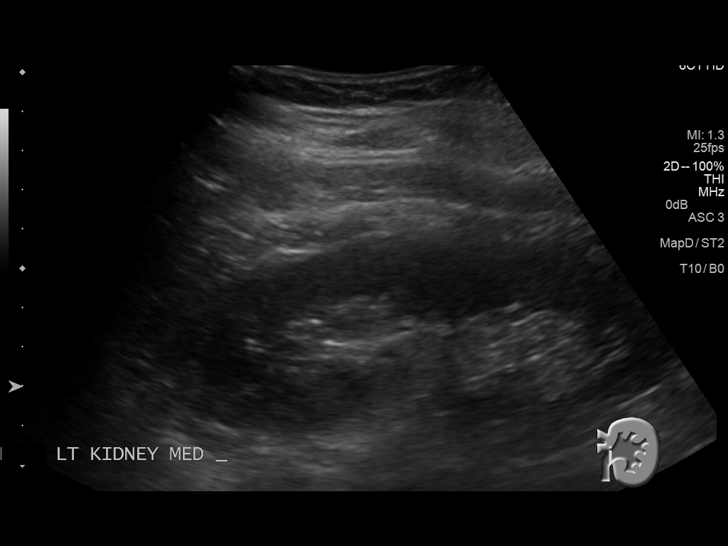
[im 35/53]
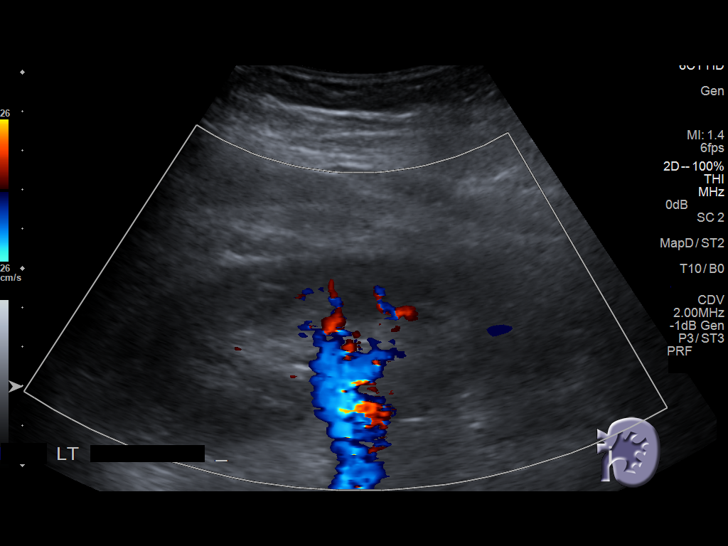
[im 40/53]
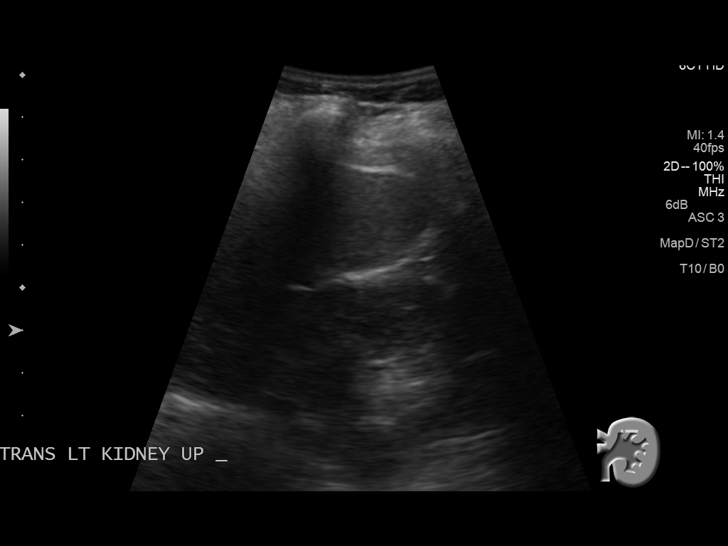
[im 44/53]
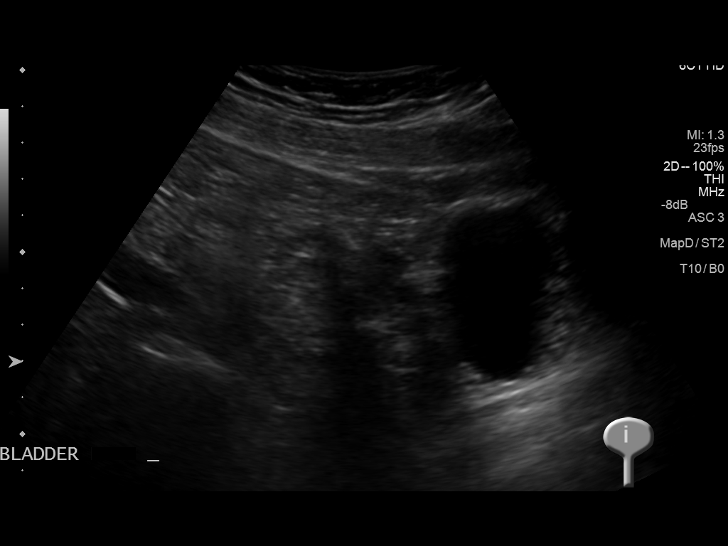
[im 48/53]
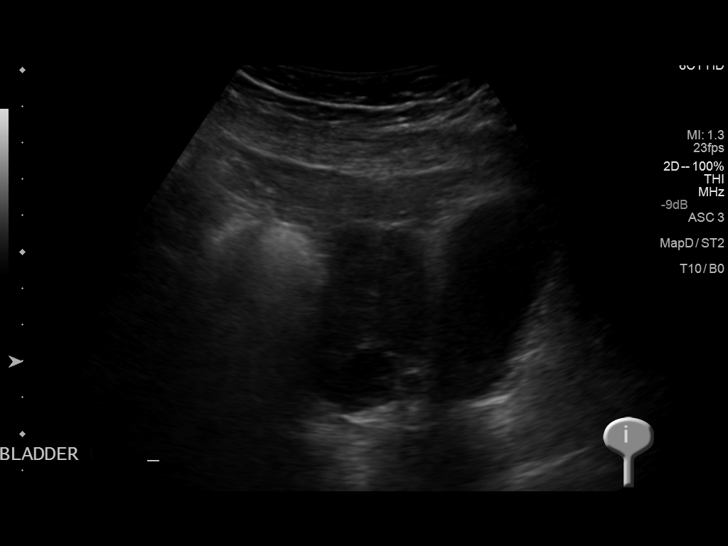
[im 53/53]
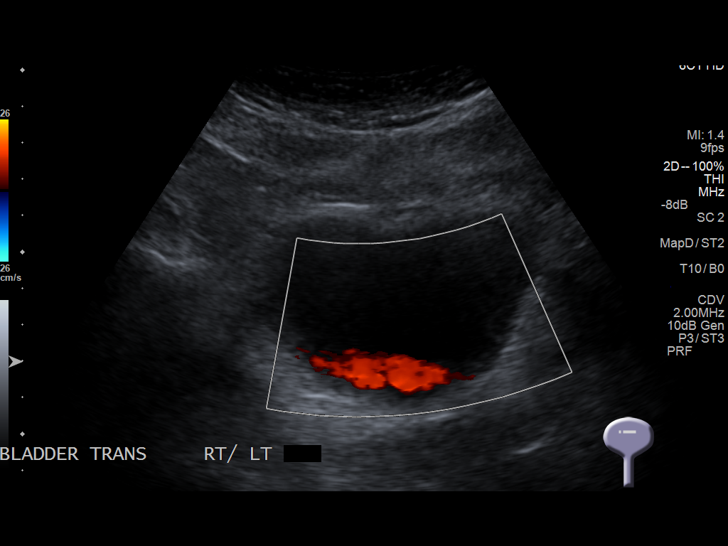

[14 of 25 positions shown; findings below may reference images not displayed]

FINDINGS: Right Kidney:

Length: 10.9 cm. Parenchymal echogenicity is within normal limits.
There are several shadowing echogenic stones, measuring up to 6 mm.
No hydronephrosis.

Left Kidney:

Length: 13.2 cm. Parenchymal echogenicity is within normal limits.
No hydronephrosis. No focal lesion.

Bladder:

Ureteral jets are seen bilaterally.
IMPRESSION: Right renal stones.  No hydronephrosis.

## 2015-11-04 ENCOUNTER — Encounter: Payer: Self-pay | Admitting: Urology

## 2016-01-11 IMAGING — CT CT ABD-PELV W/ CM
1 of 2 series · 14 of 32 positions shown, 18 images · IV contrast (omnipaque)
Comparison: No priors.

CLINICAL DATA: 33-year-old female with right lower abdominal pain
and low back pain since this morning. Difficulty urinating.

EXAM:
CT ABDOMEN AND PELVIS WITH CONTRAST
TECHNIQUE: Multidetector CT imaging of the abdomen and pelvis was performed
using the standard protocol following bolus administration of
intravenous contrast.
CONTRAST:  80mL OMNIPAQUE IOHEXOL 300 MG/ML  SOLN

[Series 2: routine abd pel with · axial · 0.66mm/px · z∈[-442,-62]mm · 14 of 87 slices shown, 18 images]
[im 7/87  soft-tissue]
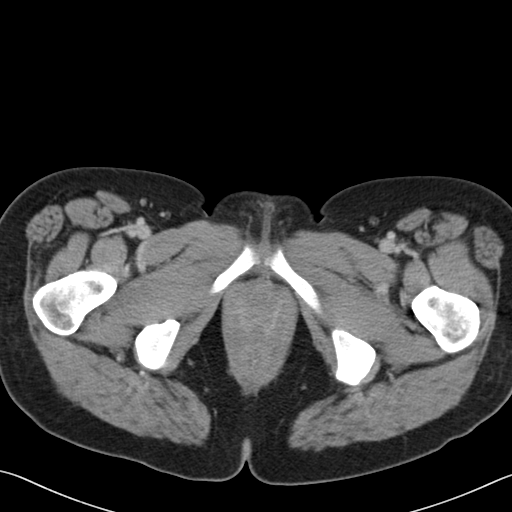
[im 7/87  bone]
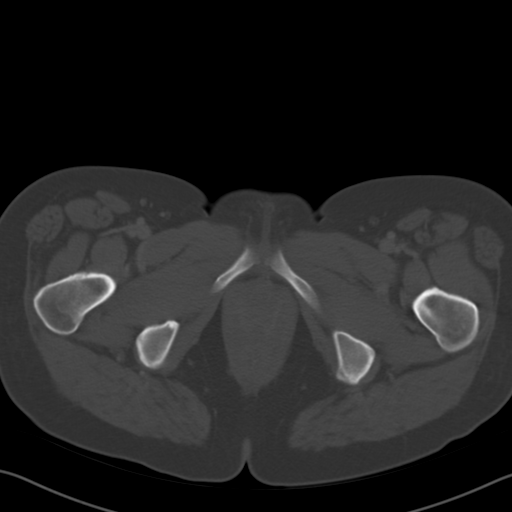
[im 14/87  soft-tissue]
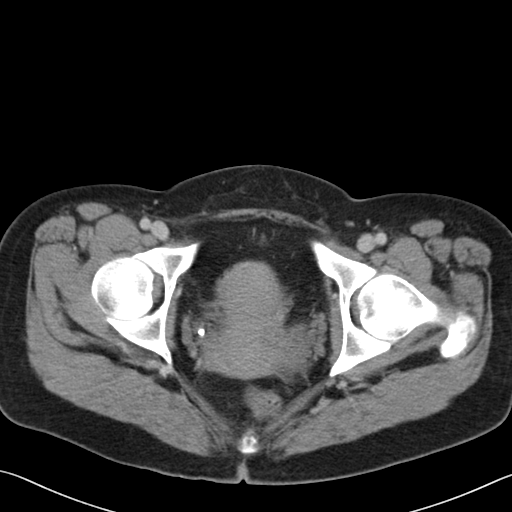
[im 21/87  soft-tissue]
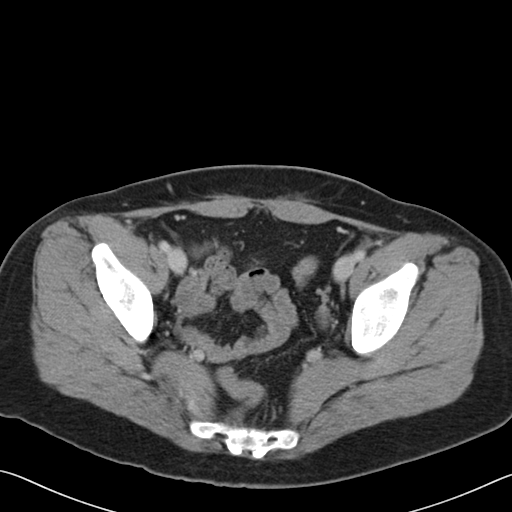
[im 28/87  soft-tissue]
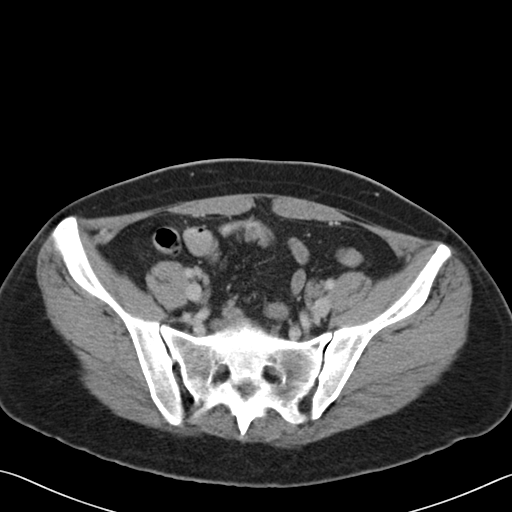
[im 35/87  soft-tissue]
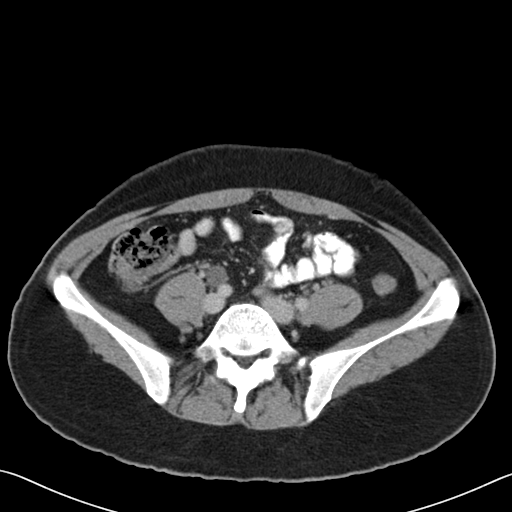
[im 42/87  soft-tissue]
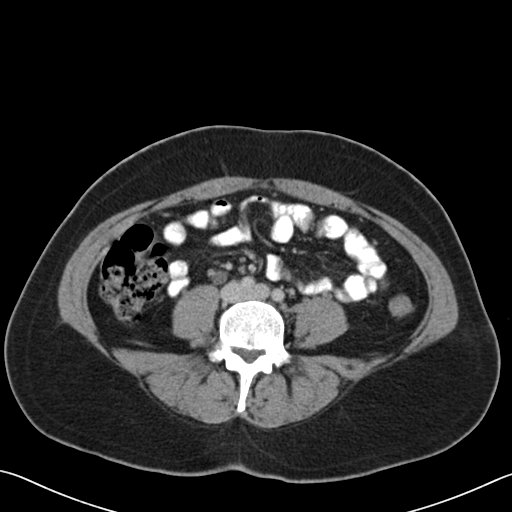
[im 49/87  soft-tissue]
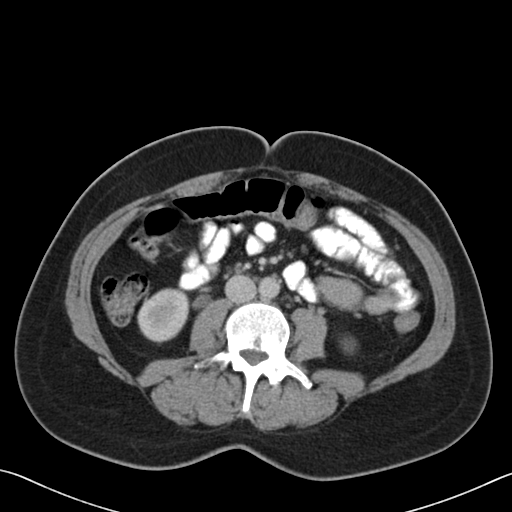
[im 56/87  soft-tissue]
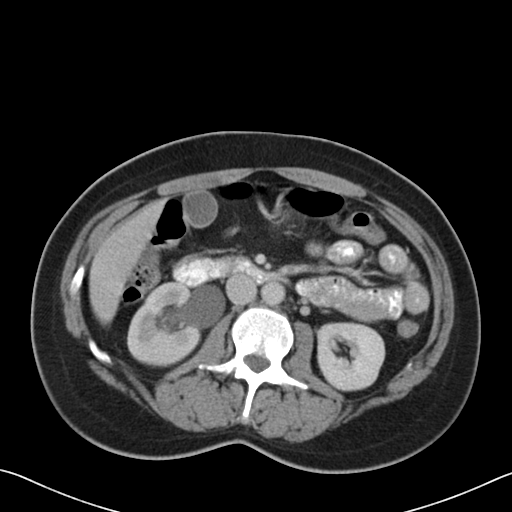
[im 62/87  soft-tissue]
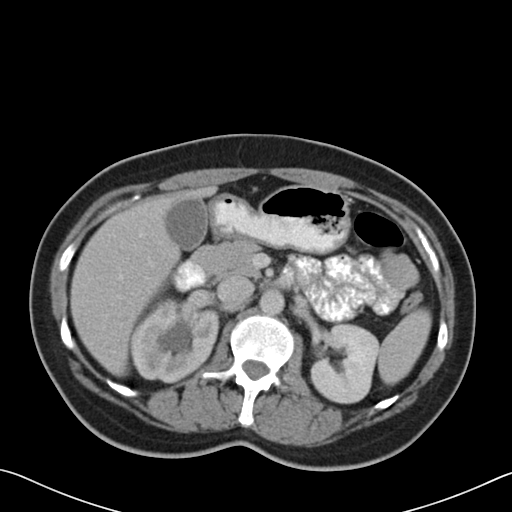
[im 62/87  bone]
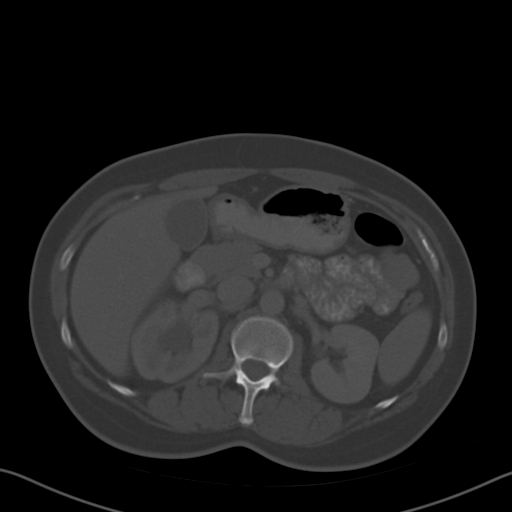
[im 69/87  soft-tissue]
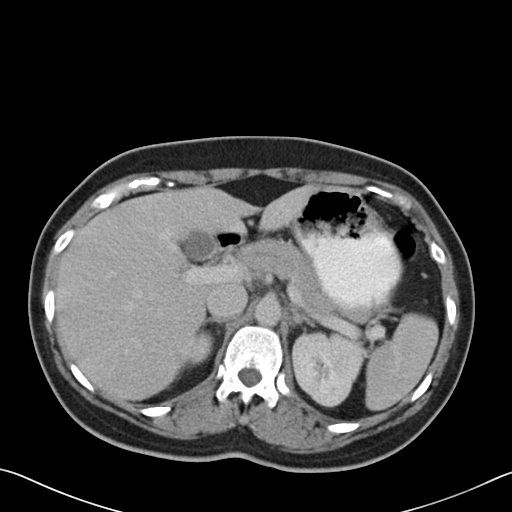
[im 73/87  lung]
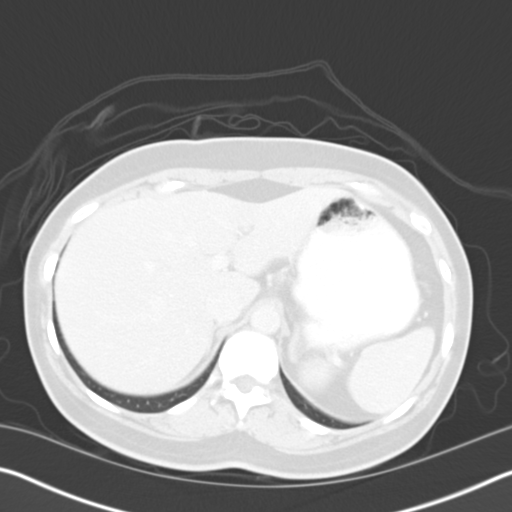
[im 76/87  soft-tissue]
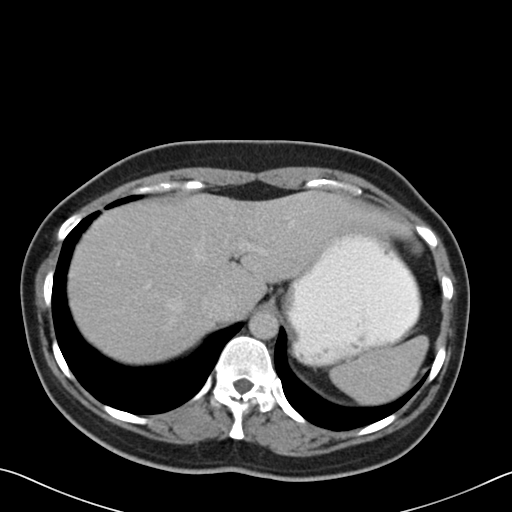
[im 76/87  lung]
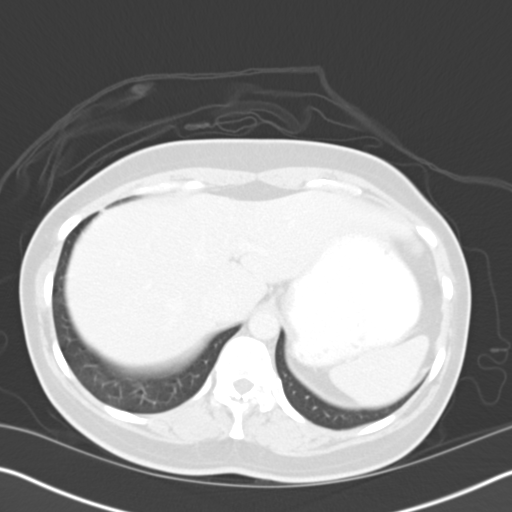
[im 80/87  lung]
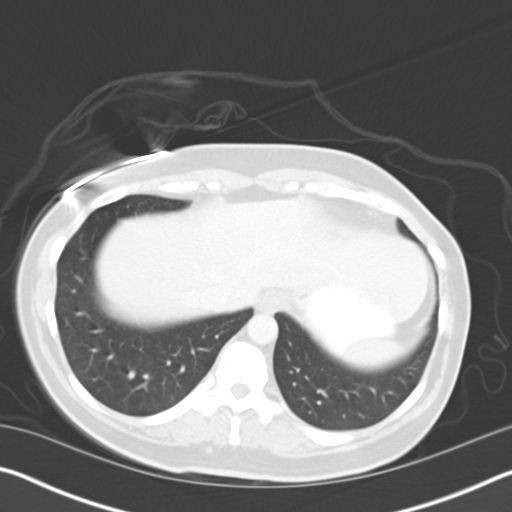
[im 83/87  soft-tissue]
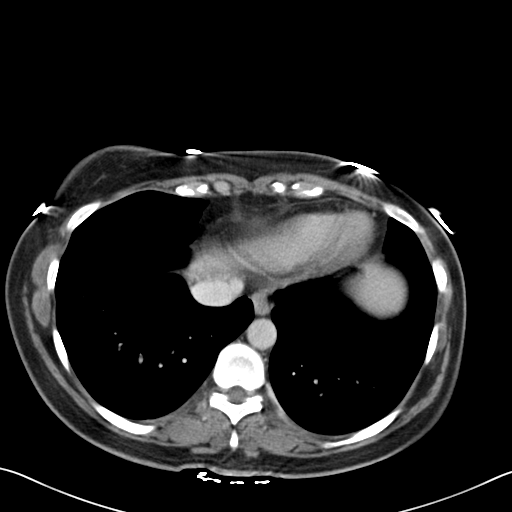
[im 83/87  lung]
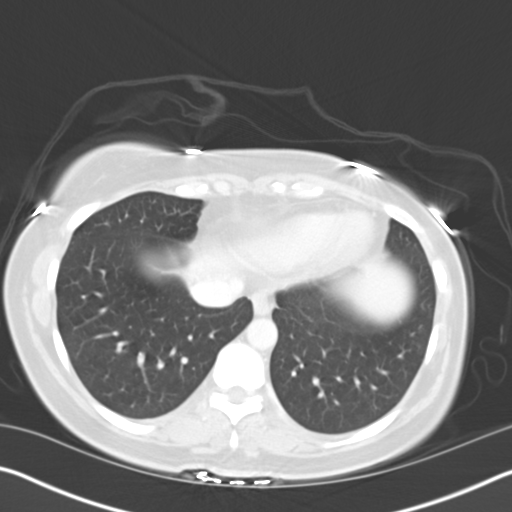

[14 of 32 positions shown; findings below may reference images not displayed]

FINDINGS: Lower chest:  Unremarkable.

Hepatobiliary: No cystic or solid hepatic lesions. No intra or
extrahepatic biliary ductal dilatation. Gallbladder is normal in
appearance.

Pancreas: No pancreatic mass. No pancreatic ductal dilatation. No
pancreatic or peripancreatic fluid or inflammatory changes.

Spleen: Unremarkable.

Adrenals/Urinary Tract: 6 mm calculus in the distal third of the
right ureter shortly before the right ureterovesicular junction
(image 74 of series 2), associated with mild right-sided
hydroureteronephrosis. Several additional small nonobstructive
calculi are present within the right renal collecting system
measuring 3-4 mm in size. Slightly delayed nephrogram on the right,
indicative of obstruction. The enhanced appearance of the kidneys is
otherwise normal bilaterally. Urinary bladder is normal in
appearance. Bilateral adrenal glands are normal in appearance.

Stomach/Bowel: Normal appearance of the stomach. No pathologic
dilatation of small bowel or colon. Normal appendix.

Vascular/Lymphatic: No significant atherosclerotic disease, aneurysm
or dissection identified in the abdominal or pelvic vasculature. No
lymphadenopathy noted in the abdomen or pelvis.

Reproductive: Uterus and left ovary are unremarkable in appearance.
Probable degenerating corpus luteum cyst in the right ovary
incidentally noted.

Other: Trace amount of free fluid in the cul-de-sac, presumably
physiologic in this young female patient. No larger volume of
ascites. No pneumoperitoneum.

Musculoskeletal: There are no aggressive appearing lytic or blastic
lesions noted in the visualized portions of the skeleton.
IMPRESSION: 1. 6 mm obstructive calculus in the distal third of the right ureter
shortly before the right ureterovesicular junction associated with
mild proximal hydroureteronephrosis.
2. Several additional nonobstructive calculi within the right renal
collecting system measuring 3-4 mm in size.
3. Normal appendix.
4. Additional incidental findings, as above.

## 2017-12-16 ENCOUNTER — Ambulatory Visit
Admission: EM | Admit: 2017-12-16 | Discharge: 2017-12-16 | Disposition: A | Discharge status: Home / Self Care | Payer: BLUE CROSS/BLUE SHIELD | Attending: Family Medicine | Admitting: Family Medicine

## 2017-12-16 DIAGNOSIS — J0100 Acute maxillary sinusitis, unspecified: Secondary | ICD-10-CM

## 2017-12-16 DIAGNOSIS — J209 Acute bronchitis, unspecified: Secondary | ICD-10-CM

## 2017-12-16 MED ORDER — FLUTICASONE PROPIONATE 50 MCG/ACT NA SUSP: 2.0000 | Freq: Every day | NASAL | 0 refills | Status: DC

## 2017-12-16 MED ORDER — ALBUTEROL SULFATE HFA 108 (90 BASE) MCG/ACT IN AERS: 1.0000 | INHALATION_SPRAY | Freq: Four times a day (QID) | RESPIRATORY_TRACT | 0 refills | Status: DC | PRN

## 2017-12-16 MED ORDER — DOXYCYCLINE HYCLATE 100 MG PO CAPS: 100.0000 mg | ORAL_CAPSULE | Freq: Two times a day (BID) | ORAL | 0 refills | Status: DC

## 2017-12-16 MED ORDER — PREDNISONE 20 MG PO TABS: 40.0000 mg | ORAL_TABLET | Freq: Every day | ORAL | 0 refills | Status: DC

## 2017-12-16 NOTE — Discharge Instructions (Signed)
Please take medications as prescribed.  Return to the ER clinic for any fevers, shortness of breath worsening symptoms or urgent changes in health.

## 2017-12-16 NOTE — ED Provider Notes (Signed)
[facility]    CSN: [id number] Arrival date & time: 12/16/17  1543     History   Chief Complaint Chief Complaint  Patient presents with  . Facial Pain    HPI Rita Heckndrea W Anspaugh is a 37 y.o. female.   Presents to the urgent care facility for evaluation of sinus pain, cough.  Symptoms have been present for 3-4 weeks.  She states 3-4 weeks ago she was diagnosed with influenza.  Was seeing a lot of improvement except for persistent cough.  Patient states a few days ago she developed severe sinus pain and pressure along the frontal and maxillary sinuses with frontal headache.  She denies any vision changes.  She states her headache is increased with looking down she has a lot of sinus drainage and pressure.  She denies any fevers.  Her cough is dry nonproductive.  She is eating and drinking well.  No nausea vomiting diarrhea.  No abdominal pain chest pain or shortness of breath.  HPI  Past Medical History:  Diagnosis Date  . Arthritis    rheumatoid  . Kidney stone   . Rheumatoid arthritis [facility](HCC)     Patient Active Problem List   Diagnosis Date Noted  . Urinary frequency 07/04/2015  . Nephrolithiasis 05/24/2015  . Hydronephrosis with urinary obstruction due to ureteral calculus 05/24/2015  . Cystocele, midline 10/03/2012  . Urgency of micturation 10/03/2012  . Difficulty in micturition 10/03/2012    Past Surgical History:  Procedure Laterality Date  . CESAREAN SECTION     x2    OB History    No data available       Home Medications    Prior to Admission medications   Medication Sig Start Date End Date Taking? Authorizing Provider  albuterol (PROVENTIL HFA;VENTOLIN HFA) 108 (90 Base) MCG/ACT inhaler Inhale 1-2 puffs into the lungs every 6 (six) hours as needed for wheezing or shortness of breath. 12/16/17   Evon SlackGaines, Thomas C, PA-C  doxycycline (VIBRAMYCIN) 100 MG capsule Take 1 capsule (100 mg total) by mouth 2 (two) times daily. 12/16/17   Evon SlackGaines, Thomas C, PA-C    fluticasone (FLONASE) 50 MCG/ACT nasal spray Place 2 sprays into both nostrils daily. 12/16/17   Evon SlackGaines, Thomas C, PA-C  meloxicam (MOBIC) 15 MG tablet  05/04/15   [provider]  mirabegron ER (MYRBETRIQ) 25 MG TB24 tablet Take 1 tablet (25 mg total) by mouth daily. 07/02/15   Harle BattiestMcGowan, Shannon A, PA-C  norgestimate-ethinyl estradiol (SPRINTEC 28) 0.25-35 MG-MCG tablet  05/05/15   [provider]  predniSONE (DELTASONE) 20 MG tablet Take 2 tablets (40 mg total) by mouth daily. 12/16/17   Evon SlackGaines, Thomas C, PA-C  sulfaSALAzine (AZULFIDINE) 500 MG tablet  05/04/15   [provider]    Family History Family History  Family history unknown: Yes    Social History Social History   Tobacco Use  . Smoking status: Never Smoker  Substance Use Topics  . Alcohol use: No    Alcohol/week: 0.0 oz  . Drug use: No     Allergies   Patient has no known allergies.   Review of Systems Review of Systems  Constitutional: Negative for fever.  HENT: Positive for congestion, rhinorrhea, sinus pressure and sinus pain. Negative for ear discharge, sore throat, trouble swallowing and voice change.   Eyes: Negative for photophobia and visual disturbance.  Respiratory: Positive for cough and wheezing (mild). Negative for shortness of breath and stridor.   Cardiovascular: Negative for chest pain.  Gastrointestinal: Negative for abdominal pain, diarrhea, nausea and vomiting.  Genitourinary: Negative for dysuria, flank pain and pelvic pain.  Musculoskeletal: Positive for myalgias. Negative for back pain.  Skin: Negative for rash.  Neurological: Negative for dizziness and headaches.     Physical Exam Triage Vital Signs ED Triage Vitals  Enc Vitals Group     BP 12/16/17 1554 (!) 142/101     Pulse Rate 12/16/17 1554 (!) 113     Resp 12/16/17 1554 (!) 22     Temp 12/16/17 1554 98.4 F (36.9 C)     Temp Source 12/16/17 1554 Oral     SpO2 12/16/17 1554 100 %     Weight --       Height --      Head Circumference --      Peak Flow --      Pain Score 12/16/17 1555 0     Pain Loc --      Pain Edu? --      Excl. in GC? --    No data found.  Updated Vital Signs BP (!) 142/101 (BP Location: Left Arm)   Pulse (!) 113   Temp 98.4 F (36.9 C) (Oral)   Resp (!) 22   SpO2 100%   Visual Acuity Right Eye Distance:   Left Eye Distance:   Bilateral Distance:    Right Eye Near:   Left Eye Near:    Bilateral Near:     Physical Exam  Constitutional: She is oriented to person, place, and time. She appears well-developed and well-nourished. No distress.  HENT:  Head: Normocephalic and atraumatic.  Right Ear: Hearing, tympanic membrane, external ear and ear canal normal.  Left Ear: Hearing, tympanic membrane, external ear and ear canal normal.  Nose: Rhinorrhea present.  Mouth/Throat: Mucous membranes are normal. No trismus in the jaw. No uvula swelling. Posterior oropharyngeal erythema present. No oropharyngeal exudate, posterior oropharyngeal edema or tonsillar abscesses. No tonsillar exudate.  Moderate frontal and maxillary sinus tenderness to palpation.  No pharyngeal erythema or exudates.  Uvula is midline.  Eyes: Conjunctivae are normal. Right eye exhibits no discharge. Left eye exhibits no discharge.  Neck: Normal range of motion.  Cardiovascular: Normal rate and regular rhythm.  Pulmonary/Chest: Effort normal. No stridor. No respiratory distress. She has wheezes (.  Subtle expiratory wheezing bilaterally no respiratory distress.  No accessory muscle use.). She has no rales.  Abdominal: Soft. She exhibits no distension. There is no tenderness.  Musculoskeletal: Normal range of motion. She exhibits no deformity.  Lymphadenopathy:    She has cervical adenopathy.  Neurological: She is alert and oriented to person, place, and time. She has normal reflexes.  Skin: Skin is warm and dry.  Psychiatric: She has a normal mood and affect. Her behavior is normal. Thought  content normal.     UC Treatments / Results  Labs (all labs ordered are listed, but only abnormal results are displayed) Labs Reviewed - No data to display  EKG  EKG Interpretation None       Radiology No results found.  Procedures Procedures (including critical care time)  Medications Ordered in UC Medications - No data to display   Initial Impression / Assessment and Plan / UC Course  I have reviewed the triage vital signs and the nursing notes.  Pertinent labs & imaging results that were available during my care of the patient were reviewed by me and considered in my medical decision making (see chart for details).  37 year old female with acute bronchitis with sinusitis.  Will place her on doxycycline.  She is given prescription for prednisone and albuterol.  She will continue with over-the-counter medications for her symptoms.  Recommend Flonase.  Patient was noted to have hypertension, recommend she discontinue her phenylephrine over-the-counter and restart her amlodipine 2.5 mg as prescribed.  She is educated on signs and symptoms to return to clinic for. Final Clinical Impressions(s) / UC Diagnoses   Final diagnoses:  Acute bronchitis, unspecified organism  Acute non-recurrent maxillary sinusitis    ED Discharge Orders        Ordered    doxycycline (VIBRAMYCIN) 100 MG capsule  2 times daily     12/16/17 1601    fluticasone (FLONASE) 50 MCG/ACT nasal spray  Daily     12/16/17 1601    predniSONE (DELTASONE) 20 MG tablet  Daily     12/16/17 1601    albuterol (PROVENTIL HFA;VENTOLIN HFA) 108 (90 Base) MCG/ACT inhaler  Every 6 hours PRN     12/16/17 1601         Evon Slack, [place] 12/16/17 1606

## 2017-12-19 ENCOUNTER — Telehealth: Payer: Self-pay | Admitting: Emergency Medicine

## 2017-12-19 NOTE — Telephone Encounter (Signed)
Called to follow up after patient's recent visit. Patient states she is feeling much better. 

## 2017-12-24 ENCOUNTER — Ambulatory Visit
Admission: EM | Admit: 2017-12-24 | Discharge: 2017-12-24 | Disposition: A | Discharge status: Home / Self Care | Payer: BLUE CROSS/BLUE SHIELD | Attending: Family Medicine | Admitting: Family Medicine

## 2017-12-24 ENCOUNTER — Other Ambulatory Visit: Payer: Self-pay

## 2017-12-24 ENCOUNTER — Encounter: Payer: Self-pay | Admitting: Emergency Medicine

## 2017-12-24 DIAGNOSIS — J069 Acute upper respiratory infection, unspecified: Secondary | ICD-10-CM | POA: Diagnosis not present

## 2017-12-24 MED ORDER — HYDROCODONE-HOMATROPINE 5-1.5 MG/5ML PO SYRP: 5.0000 mL | ORAL_SOLUTION | Freq: Four times a day (QID) | ORAL | 0 refills | Status: DC | PRN

## 2017-12-24 MED ORDER — AEROCHAMBER PLUS FLO-VU MEDIUM MISC: 1.0000 | Freq: Once | Status: DC

## 2017-12-24 MED ORDER — BENZONATATE 200 MG PO CAPS: ORAL_CAPSULE | ORAL | 0 refills | Status: DC

## 2017-12-24 NOTE — ED Provider Notes (Signed)
[facility]    CSN: [id number] Arrival date & time: 12/24/17  1826     History   Chief Complaint Chief Complaint  Patient presents with  . Cough  . Headache  . Otalgia    HPI Rita Baker is a 37 y.o. female.   HPI  37 year old female is with cough and congestion that she has had for 3 weeks.  Was seen here on 12/16/2017 diagnosed with acute bronchitis and sinusitis.  Was placed on doxycycline prednisone Flonase and albuterol.  Initially she was feeling better but has since returned to her  significant coughing.  The time feel short of breath.  She was using the albuterol inhaler but states that she was getting jittery from it and is using it intermittently.  He has a nonproductive cough and she has been having chest discomfort when she coughs because of the frequency.  In addition she has had severe ear ache bilaterally; has a problem with her ears all of her life.      Past Medical History:  Diagnosis Date  . Arthritis    rheumatoid  . Kidney stone   . Rheumatoid arthritis [facility])     Patient Active Problem List   Diagnosis Date Noted  . Urinary frequency 07/04/2015  . Nephrolithiasis 05/24/2015  . Hydronephrosis with urinary obstruction due to ureteral calculus 05/24/2015  . Cystocele, midline 10/03/2012  . Urgency of micturation 10/03/2012  . Difficulty in micturition 10/03/2012    Past Surgical History:  Procedure Laterality Date  . CESAREAN SECTION     x2    OB History    No data available       Home Medications    Prior to Admission medications   Medication Sig Start Date End Date Taking? Authorizing Provider  doxycycline (VIBRAMYCIN) 100 MG capsule Take 1 capsule (100 mg total) by mouth 2 (two) times daily. 12/16/17  Yes Evon Slack, PA-C  fluticasone (FLONASE) 50 MCG/ACT nasal spray Place 2 sprays into both nostrils daily. 12/16/17  Yes Evon Slack, PA-C  albuterol (PROVENTIL HFA;VENTOLIN HFA) 108 (90 Base) MCG/ACT inhaler  Inhale 1-2 puffs into the lungs every 6 (six) hours as needed for wheezing or shortness of breath. 12/16/17   Evon Slack, PA-C  benzonatate (TESSALON) 200 MG capsule Take one cap TID PRN cough 12/24/17   Ovid Curd P, PA-C  HYDROcodone-homatropine [facility]) 5-1.5 MG/5ML syrup Take 5 mLs by mouth every 6 (six) hours as needed for cough. 12/24/17   Lutricia Feil, PA-C  meloxicam (MOBIC) 15 MG tablet  05/04/15   [provider]  mirabegron ER (MYRBETRIQ) 25 MG TB24 tablet Take 1 tablet (25 mg total) by mouth daily. 07/02/15   Harle Battiest, PA-C  norgestimate-ethinyl estradiol (SPRINTEC 28) 0.25-35 MG-MCG tablet  05/05/15   [provider]  sulfaSALAzine (AZULFIDINE) 500 MG tablet  05/04/15   [provider]    Family History Family History  Family history unknown: Yes    Social History Social History   Tobacco Use  . Smoking status: Never Smoker  . Smokeless tobacco: Never Used  Substance Use Topics  . Alcohol use: No    Alcohol/week: 0.0 oz  . Drug use: No     Allergies   Patient has no known allergies.   Review of Systems Review of Systems  Constitutional: Positive for activity change. Negative for chills, fatigue and fever.  HENT: Positive for congestion and ear pain.   Respiratory: Positive for cough  and shortness of breath.   All other systems reviewed and are negative.    Physical Exam Triage Vital Signs ED Triage Vitals  Enc Vitals Group     BP 12/24/17 1904 (!) 151/99     Pulse Rate 12/24/17 1904 77     Resp 12/24/17 1904 16     Temp 12/24/17 1904 98.4 F (36.9 C)     Temp Source 12/24/17 1904 Oral     SpO2 12/24/17 1904 100 %     Weight 12/24/17 1901 136 lb (61.7 kg)     Height 12/24/17 1901  (1.626 m)     Head Circumference --      Peak Flow --      Pain Score 12/24/17 1901 7     Pain Loc --      Pain Edu? --      Excl. in GC? --    No data found.  Updated Vital Signs BP (!) 151/99   Pulse 77   Temp  98.4 F (36.9 C) (Oral)   Resp 16   Ht  (1.626 m)   Wt 136 lb (61.7 kg)   LMP 12/20/2017 (Exact Date)   SpO2 100%   BMI 23.34 kg/m   Visual Acuity Right Eye Distance:   Left Eye Distance:   Bilateral Distance:    Right Eye Near:   Left Eye Near:    Bilateral Near:     Physical Exam  Constitutional: She is oriented to person, place, and time. She appears well-developed and well-nourished.  Non-toxic appearance. She does not appear ill. No distress.  HENT:  Head: Normocephalic.  Mouth/Throat: Oropharynx is clear and moist.  Eyes: Pupils are equal, round, and reactive to light.  Neck: Normal range of motion.  Pulmonary/Chest: Effort normal and breath sounds normal. No stridor. No respiratory distress. She has no wheezes. She has no rales.  Musculoskeletal: Normal range of motion.  Neurological: She is alert and oriented to person, place, and time. She has normal strength.  Skin: Skin is warm and dry.  Psychiatric: She has a normal mood and affect. Her behavior is normal.  Nursing note and vitals reviewed.    UC Treatments / Results  Labs (all labs ordered are listed, but only abnormal results are displayed) Labs Reviewed - No data to display  EKG  EKG Interpretation None       Radiology No results found.  Procedures Procedures (including critical care time)  Medications Ordered in UC Medications - No data to display   Initial Impression / Assessment and Plan / UC Course  I have reviewed the triage vital signs and the nursing notes.  Pertinent labs & imaging results that were available during my care of the patient were reviewed by me and considered in my medical decision making (see chart for details).     Plan: 1. Test/x-ray results and diagnosis reviewed with patient 2. rx as per orders; risks, benefits, potential side effects reviewed with patient 3. Recommend supportive treatment with hycodan or Tessalon Perles control the cough.  Urged her to  continue on using the Flonase for the eustachian tube dysfunction that she is rinsing from the cold items.  Given her an AeroChamber to help facilitate better use of the albuterol inhaler for shortness of breath.  Urged any or use of prednisone. Will  not prescribe any further antibiotics as doxycycline should have covered any possible organism. 4. F/u prn if symptoms worsen or don't improve   Final  Clinical Impressions(s) / UC Diagnoses   Final diagnoses:  Upper respiratory tract infection, unspecified type    ED Discharge Orders        Ordered    benzonatate (TESSALON) 200 MG capsule     12/24/17 2030    HYDROcodone-homatropine (HYCODAN) 5-1.5 MG/5ML syrup  Every 6 hours PRN     12/24/17 2030       Controlled Substance Prescriptions [place] Controlled Substance Registry consulted? Not Applicable   Lutricia Feil, PA-C 12/24/17 2044

## 2017-12-24 NOTE — ED Triage Notes (Signed)
Patient c/o ongiong cough and congestion for 3 weeks.  Patient c/o HAs and bilateral ear pain that started on Friday.  Patient reports fevers.

## 2018-03-17 ENCOUNTER — Encounter: Payer: Self-pay | Admitting: Gynecology

## 2018-03-17 ENCOUNTER — Ambulatory Visit
Admission: EM | Admit: 2018-03-17 | Discharge: 2018-03-17 | Disposition: A | Discharge status: Home / Self Care | Payer: BLUE CROSS/BLUE SHIELD | Attending: Family Medicine | Admitting: Family Medicine

## 2018-03-17 ENCOUNTER — Other Ambulatory Visit: Payer: Self-pay

## 2018-03-17 DIAGNOSIS — R110 Nausea: Secondary | ICD-10-CM | POA: Diagnosis not present

## 2018-03-17 DIAGNOSIS — M5489 Other dorsalgia: Secondary | ICD-10-CM

## 2018-03-17 DIAGNOSIS — N200 Calculus of kidney: Secondary | ICD-10-CM

## 2018-03-17 DIAGNOSIS — R109 Unspecified abdominal pain: Secondary | ICD-10-CM | POA: Diagnosis not present

## 2018-03-17 LAB — URINALYSIS, COMPLETE (UACMP) WITH MICROSCOPIC
BACTERIA UA: NONE SEEN
BILIRUBIN URINE: NEGATIVE
Glucose, UA: NEGATIVE mg/dL
Ketones, ur: NEGATIVE mg/dL
LEUKOCYTES UA: NEGATIVE
Nitrite: NEGATIVE
Protein, ur: NEGATIVE mg/dL
SPECIFIC GRAVITY, URINE: 1.02 (ref 1.005–1.030)
pH: 5.5 (ref 5.0–8.0)

## 2018-03-17 MED ORDER — TAMSULOSIN HCL 0.4 MG PO CAPS: 0.4000 mg | ORAL_CAPSULE | Freq: Every day | ORAL | 0 refills | Status: DC

## 2018-03-17 MED ORDER — HYDROCODONE-ACETAMINOPHEN 5-325 MG PO TABS: ORAL_TABLET | ORAL | 0 refills | Status: DC

## 2018-03-17 NOTE — Discharge Instructions (Signed)
Increase water intake

## 2018-03-17 NOTE — ED Triage Notes (Signed)
Per patient c/o lower back pain x couple days.

## 2018-03-17 NOTE — ED Provider Notes (Signed)
[facility]    CSN: [id number] Arrival date & time: 03/17/18  1341     History   Chief Complaint Chief Complaint  Patient presents with  . Back Pain    HPI Rita Baker is a 37 y.o. female.   37 yo female with a c/o right flank/back pain x 2-3 days progressively worsening, radiating towards the groin and associated with nausea. Patient states she has a h/o kidney stones and current symptoms feel similar to prior kidney stone episodes.   The history is provided by the patient.    Past Medical History:  Diagnosis Date  . Arthritis    rheumatoid  . Kidney stone   . Rheumatoid arthritis [facility])     Patient Active Problem List   Diagnosis Date Noted  . Urinary frequency 07/04/2015  . Nephrolithiasis 05/24/2015  . Hydronephrosis with urinary obstruction due to ureteral calculus 05/24/2015  . Cystocele, midline 10/03/2012  . Urgency of micturation 10/03/2012  . Difficulty in micturition 10/03/2012    Past Surgical History:  Procedure Laterality Date  . CESAREAN SECTION     x2  . CHOLECYSTECTOMY      OB History   None      Home Medications    Prior to Admission medications   Medication Sig Start Date End Date Taking? Authorizing Provider  amLODipine (NORVASC) 2.5 MG tablet TK 1 T PO QD 01/02/18  Yes [provider]  benzonatate (TESSALON) 200 MG capsule Take one cap TID PRN cough 12/24/17  Yes Lutricia Feil, PA-C  folic acid (FOLVITE) 1 MG tablet TK 1 T PO QD 01/02/18  Yes [provider]  methotrexate 2.5 MG tablet methotrexate sodium 2.5 mg tablet  TK 8 TS PO ONCE Q WEEK   Yes [provider]  mirabegron ER (MYRBETRIQ) 25 MG TB24 tablet Take 1 tablet (25 mg total) by mouth daily. 07/02/15  Yes McGowan, Wellington Hampshire, PA-C  norgestimate-ethinyl estradiol (SPRINTEC 28) 0.25-35 MG-MCG tablet Sprintec (28) 0.25 mg-35 mcg tablet   Yes [provider]  albuterol (PROVENTIL HFA;VENTOLIN HFA) 108 (90 Base) MCG/ACT inhaler  Inhale 1-2 puffs into the lungs every 6 (six) hours as needed for wheezing or shortness of breath. 12/16/17   Evon Slack, PA-C  doxycycline (VIBRAMYCIN) 100 MG capsule Take 1 capsule (100 mg total) by mouth 2 (two) times daily. 12/16/17   Evon Slack, PA-C  fluticasone (FLONASE) 50 MCG/ACT nasal spray Place 2 sprays into both nostrils daily. 12/16/17   Evon Slack, PA-C  HYDROcodone-homatropine (HYCODAN) 5-1.5 MG/5ML syrup Take 5 mLs by mouth every 6 (six) hours as needed for cough. 12/24/17   Lutricia Feil, PA-C  meloxicam [facility]) 15 MG tablet  05/04/15   [provider]  sulfaSALAzine (AZULFIDINE) 500 MG tablet  05/04/15   [provider]    Family History Family History  Problem Relation Age of Onset  . Stroke Mother   . Hypertension Mother   . Diabetes Father   . Hypertension Father     Social History Social History   Tobacco Use  . Smoking status: Never Smoker  . Smokeless tobacco: Never Used  Substance Use Topics  . Alcohol use: No    Alcohol/week: 0.0 oz  . Drug use: No     Allergies   Patient has no known allergies.   Review of Systems Review of Systems   Physical Exam Triage Vital Signs ED Triage Vitals  Enc Vitals Group  BP 03/17/18 1350 (!) 134/100     Pulse Rate 03/17/18 1350 90     Resp 03/17/18 1350 16     Temp 03/17/18 1350 98.6 F (37 C)     Temp Source 03/17/18 1350 Oral     SpO2 03/17/18 1350 99 %     Weight 03/17/18 1353 132 lb (59.9 kg)     Height 03/17/18 1353  (1.626 m)     Head Circumference --      Peak Flow --      Pain Score 03/17/18 1352 7     Pain Loc --      Pain Edu? --      Excl. in GC? --    No data found.  Updated Vital Signs BP (!) 134/100 (BP Location: Left Arm)   Pulse 90   Temp 98.6 F (37 C) (Oral)   Resp 16   Ht  (1.626 m)   Wt 132 lb (59.9 kg)   LMP 03/15/2018   SpO2 99%   BMI 22.66 kg/m   Visual Acuity Right Eye Distance:   Left Eye Distance:   Bilateral  Distance:    Right Eye Near:   Left Eye Near:    Bilateral Near:     Physical Exam  Constitutional: She appears well-developed and well-nourished. No distress.  Abdominal: Soft. Bowel sounds are normal. She exhibits no distension and no mass. There is tenderness (right flank). There is no rebound and no guarding.  Skin: She is not diaphoretic.  Nursing note and vitals reviewed.    UC Treatments / Results  Labs (all labs ordered are listed, but only abnormal results are displayed) Labs Reviewed  URINALYSIS, COMPLETE (UACMP) WITH MICROSCOPIC - Abnormal; Notable for the following components:      Result Value   Hgb urine dipstick TRACE (*)    All other components within normal limits    EKG None  Radiology No results found.  Procedures Procedures (including critical care time)  Medications Ordered in UC Medications - No data to display  Initial Impression / Assessment and Plan / UC Course  I have reviewed the triage vital signs and the nursing notes.  Pertinent labs & imaging results that were available during my care of the patient were reviewed by me and considered in my medical decision making (see chart for details).      Final Clinical Impressions(s) / UC Diagnoses   Final diagnoses:  Right flank pain  Nephrolithiasis    ED Prescriptions    None     1. Lab results and diagnosis reviewed with patient 2. rx as per orders above; reviewed possible side effects, interactions, risks and benefits  3. Recommend supportive treatment with increase water intake 4. Follow-up prn if symptoms worsen or don't improve   Controlled Substance Prescriptions [place] Controlled Substance Registry consulted? Not Applicable   Payton Mccallum, MD 03/17/18 [phone]

## 2018-06-11 ENCOUNTER — Ambulatory Visit
Admission: EM | Admit: 2018-06-11 | Discharge: 2018-06-11 | Disposition: A | Discharge status: Home / Self Care | Payer: BLUE CROSS/BLUE SHIELD | Attending: Family Medicine | Admitting: Family Medicine

## 2018-06-11 ENCOUNTER — Encounter: Payer: Self-pay | Admitting: Emergency Medicine

## 2018-06-11 ENCOUNTER — Other Ambulatory Visit: Payer: Self-pay

## 2018-06-11 DIAGNOSIS — M546 Pain in thoracic spine: Secondary | ICD-10-CM | POA: Diagnosis not present

## 2018-06-11 MED ORDER — MELOXICAM 15 MG PO TABS: 15.0000 mg | ORAL_TABLET | Freq: Every day | ORAL | 0 refills | Status: AC

## 2018-06-11 MED ORDER — LIDOCAINE HCL (PF) 1 % IJ SOLN: 1.0000 mL | Freq: Once | INTRAMUSCULAR | Status: DC

## 2018-06-11 MED ORDER — METAXALONE 800 MG PO TABS: 800.0000 mg | ORAL_TABLET | Freq: Three times a day (TID) | ORAL | 0 refills | Status: DC

## 2018-06-11 MED ORDER — TIZANIDINE HCL 4 MG PO CAPS: 4.0000 mg | ORAL_CAPSULE | Freq: Three times a day (TID) | ORAL | 0 refills | Status: AC

## 2018-06-11 MED ORDER — TRIAMCINOLONE ACETONIDE 40 MG/ML IJ SUSP
40.0000 mg | Freq: Once | INTRAMUSCULAR | Status: AC
  Administered 2018-06-11: 40 mg via INTRAMUSCULAR

## 2018-06-11 NOTE — Discharge Instructions (Addendum)
Apply ice 20 minutes out of every 2 hours 4-5 times daily for comfort. Use  Caution while taking muscle relaxers.  Do not perform activities requiring concentration or judgment and do not drive.  °

## 2018-06-11 NOTE — ED Provider Notes (Addendum)
[facility]    CSN: [id number] Arrival date & time: 06/11/18  0800     History   Chief Complaint Chief Complaint  Patient presents with  . Back Pain    HPI Rita Baker is a 37 y.o. female.   HPI  37 year old female presents with back pain indicating the spinous process of approximately T8 as well as left-sided pain.  She denies any neck pain.  No lumbar pain.  States this is nothing like her kidney stones in the past.  States that it started last night and it happened after she did laundry.  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  Not had any injury to the area has not pressed up against anything hard for a protracted period of time and has not  been doing anything out of her normal routine.  It has no significant radicular symptoms.  She does state that it hurts more when she takes in a deep breath.  Has had no cough or chills.  Had a fever is afebrile today.  Blood pressure is elevated at 145/108 likely from the pain.  She rates the pain as a 8 out of 10.          Past Medical History:  Diagnosis Date  . Arthritis    rheumatoid  . Kidney stone   . Rheumatoid arthritis [facility](HCC)     Patient Active Problem List   Diagnosis Date Noted  . Urinary frequency 07/04/2015  . Nephrolithiasis 05/24/2015  . Hydronephrosis with urinary obstruction due to ureteral calculus 05/24/2015  . Cystocele, midline 10/03/2012  . Urgency of micturation 10/03/2012  . Difficulty in micturition 10/03/2012    Past Surgical History:  Procedure Laterality Date  . CESAREAN SECTION     x2  . CHOLECYSTECTOMY      OB History   None      Home Medications    Prior to Admission medications   Medication Sig Start Date End Date Taking? Authorizing Provider  folic acid (FOLVITE) 1 MG tablet TK 1 T PO QD 01/02/18  Yes [provider]  methotrexate 2.5 MG tablet methotrexate sodium 2.5 mg  tablet  TK 8 TS PO ONCE Q WEEK   Yes [provider]  norgestimate-ethinyl estradiol (SPRINTEC 28) 0.25-35 MG-MCG tablet Sprintec (28) 0.25 mg-35 mcg tablet   Yes [provider]  meloxicam (MOBIC) 15 MG tablet Take 1 tablet (15 mg total) by mouth daily. 06/11/18   Lutricia Feiloemer, Tegh Franek P, PA-C  tiZANidine (ZANAFLEX) 4 MG capsule Take 1 capsule (4 mg total) by mouth 3 (three) times daily. 06/11/18   Lutricia Feiloemer, Deandra Gadson P, PA-C    Family History Family History  Problem Relation Age of Onset  . Stroke Mother   . Hypertension Mother   . Diabetes Father   . Hypertension Father     Social History Social History   Tobacco Use  . Smoking status: Never Smoker  . Smokeless tobacco: Never Used  Substance Use Topics  . Alcohol use: No    Alcohol/week: 0.0 standard drinks  . Drug use: No     Allergies   Patient has no known allergies.   Review of Systems Review of Systems  Constitutional: Positive for activity change. Negative for appetite change, chills, fatigue and fever.  Respiratory: Negative for cough and shortness of breath.   Musculoskeletal: Positive for arthralgias and back pain.  All other systems  reviewed and are negative.    Physical Exam Triage Vital Signs ED Triage Vitals  Enc Vitals Group     BP 06/11/18 0812 (!) 145/108     Pulse Rate 06/11/18 0812 86     Resp 06/11/18 0812 18     Temp 06/11/18 0812 98.2 F (36.8 C)     Temp Source 06/11/18 0812 Oral     SpO2 06/11/18 0812 99 %     Weight 06/11/18 0809 130 lb (59 kg)     Height 06/11/18 0809 5\' 4"  (1.626 m)     Head Circumference --      Peak Flow --      Pain Score 06/11/18 0809 8     Pain Loc --      Pain Edu? --      Excl. in GC? --    No data found.  Updated Vital Signs BP (!) 145/108 (BP Location: Left Arm)   Pulse 86   Temp 98.2 F (36.8 C) (Oral)   Resp 18   Ht 5\' 4"  (1.626 m)   Wt 130 lb (59 kg)   LMP 06/06/2018   SpO2 99%   BMI 22.31 kg/m   Visual Acuity Right Eye  Distance:   Left Eye Distance:   Bilateral Distance:    Right Eye Near:   Left Eye Near:    Bilateral Near:     Physical Exam  Constitutional: She is oriented to person, place, and time. She appears well-developed and well-nourished. No distress.  HENT:  Head: Normocephalic.  Eyes: Pupils are equal, round, and reactive to light. Right eye exhibits no discharge. Left eye exhibits no discharge.  Neck: Normal range of motion. Neck supple.  Pulmonary/Chest: Effort normal and breath sounds normal.  Musculoskeletal: She exhibits tenderness.  Exam of the back shows thoracic rotation to the right normal to the left is slightly decreased with the discomfort localized to the T8-T9 levels.  She has very well defined discrete trigger point over the T8 spinous process.  This reproduces her symptoms.  She has very mild pain over the parous is muscles in this area.  There is no rib pain.  Flexion does not cause any discomfort.  Neurological: She is alert and oriented to person, place, and time.  Skin: Skin is warm and dry. She is not diaphoretic.  Psychiatric: She has a normal mood and affect. Her behavior is normal. Judgment and thought content normal.  Nursing note and vitals reviewed.    UC Treatments / Results  Labs (all labs ordered are listed, but only abnormal results are displayed) Labs Reviewed - No data to display  EKG None  Radiology No results found.  Procedures .  After explaining the procedure to the patient and having her permission, the area was widely prepped with chlorhexidine solution.  Utilizing a 25-gauge 1 inch needle. 1 cc of 1% lidocaine without and 40 mg of Aristospan mixture was infiltrated into the area without complication.  Area was then massaged for short period of time and a Band-Aid applied.  Patient tolerated procedure well.  Post procedure the patient reported her pain had decreased from an 8 to a 2.  Better range of motion.        Medications Ordered in  UC Medications  triamcinolone acetonide (KENALOG-40) injection 40 mg (40 mg Intramuscular Given 06/11/18 0858)    Initial Impression / Assessment and Plan / UC Course  I have reviewed the triage vital signs and the nursing notes.  Pertinent labs & imaging results that were available during my care of the patient were reviewed by me and considered in my medical decision making (see chart for details).     Plan: 1. Test/x-ray results and diagnosis reviewed with patient 2. rx as per orders; risks, benefits, potential side effects reviewed with patient 3. Recommend supportive treatment with ice rest and symptom avoidance.  She was cautioned that after the lidocaine wears off that it may be more painful in the area until the steroid begins to exsert its effect.  Follow-up with her primary care physician if there is any question.  I had offered to perform an x-ray the patient preferred a more conservative approach.  She opted for injection into the trigger point and see how that helps.  Commend she follow-up with her primary care physician if further problems. 4. F/u prn if symptoms worsen or don't improve  Final Clinical Impressions(s) / UC Diagnoses   Final diagnoses:  Trigger point of thoracic region     Discharge Instructions     Apply ice 20 minutes out of every 2 hours 4-5 times daily for comfort. Use  Caution while taking muscle relaxers.  Do not perform activities requiring concentration or judgment and do not drive.     ED Prescriptions    Medication Sig Dispense Auth. Provider   meloxicam (MOBIC) 15 MG tablet Take 1 tablet (15 mg total) by mouth daily. 30 tablet Lutricia FeilRoemer, Tanai Bouler P, PA-C   metaxalone (SKELAXIN) 800 MG tablet  (Status: Discontinued) Take 1 tablet (800 mg total) by mouth 3 (three) times daily. 21 tablet Ovid Curdoemer, Shaneequa Bahner P, PA-C   tiZANidine (ZANAFLEX) 4 MG capsule Take 1 capsule (4 mg total) by mouth 3 (three) times daily. 21 capsule Lutricia Feiloemer, Kandis Henry P, PA-C      Controlled Substance Prescriptions [place] Controlled Substance Registry consulted? Not Applicable   Lutricia FeilRoemer, Yer Castello P, PA-C 06/11/18 [id number]    Lutricia Feiloemer, Honestii Marton P, PA-C 06/11/18 0957    Lutricia Feiloemer, Vonnie Ligman P, PA-C 06/11/18 1144

## 2018-06-11 NOTE — ED Triage Notes (Signed)
Patient c/o left side mid back pain that started last night after doing laundry. Patient has tried OTC Motrin and a TENS unit last night.

## 2019-10-27 ENCOUNTER — Ambulatory Visit: Payer: BC Managed Care – PPO | Attending: Internal Medicine
